# Patient Record
Sex: Male | Born: 1970
Health system: Southern US, Community
[De-identification: ages and names within clinical notes are randomized; demographics above are authoritative.]

## PROBLEM LIST (undated history)

## (undated) DIAGNOSIS — D497 Neoplasm of unspecified behavior of endocrine glands and other parts of nervous system: Secondary | ICD-10-CM

## (undated) DIAGNOSIS — G40209 Localization-related (focal) (partial) symptomatic epilepsy and epileptic syndromes with complex partial seizures, not intractable, without status epilepticus: Secondary | ICD-10-CM

## (undated) DIAGNOSIS — N200 Calculus of kidney: Secondary | ICD-10-CM

## (undated) DIAGNOSIS — F329 Major depressive disorder, single episode, unspecified: Secondary | ICD-10-CM

## (undated) DIAGNOSIS — Z87442 Personal history of urinary calculi: Secondary | ICD-10-CM

## (undated) DIAGNOSIS — M1990 Unspecified osteoarthritis, unspecified site: Secondary | ICD-10-CM

## (undated) DIAGNOSIS — Z9884 Bariatric surgery status: Secondary | ICD-10-CM

## (undated) DIAGNOSIS — M25562 Pain in left knee: Secondary | ICD-10-CM

## (undated) DIAGNOSIS — M25561 Pain in right knee: Secondary | ICD-10-CM

## (undated) DIAGNOSIS — K912 Postsurgical malabsorption, not elsewhere classified: Secondary | ICD-10-CM

## (undated) DIAGNOSIS — K219 Gastro-esophageal reflux disease without esophagitis: Secondary | ICD-10-CM

## (undated) DIAGNOSIS — J9601 Acute respiratory failure with hypoxia: Secondary | ICD-10-CM

## (undated) DIAGNOSIS — M1250 Traumatic arthropathy, unspecified site: Secondary | ICD-10-CM

## (undated) DIAGNOSIS — I10 Essential (primary) hypertension: Secondary | ICD-10-CM

## (undated) DIAGNOSIS — Z6841 Body Mass Index (BMI) 40.0 and over, adult: Secondary | ICD-10-CM

## (undated) DIAGNOSIS — K5790 Diverticulosis of intestine, part unspecified, without perforation or abscess without bleeding: Secondary | ICD-10-CM

## (undated) DIAGNOSIS — G43909 Migraine, unspecified, not intractable, without status migrainosus: Secondary | ICD-10-CM

## (undated) DIAGNOSIS — T8859XA Other complications of anesthesia, initial encounter: Secondary | ICD-10-CM

## (undated) DIAGNOSIS — G2581 Restless legs syndrome: Secondary | ICD-10-CM

## (undated) DIAGNOSIS — J90 Pleural effusion, not elsewhere classified: Secondary | ICD-10-CM

## (undated) DIAGNOSIS — J45909 Unspecified asthma, uncomplicated: Secondary | ICD-10-CM

## (undated) DIAGNOSIS — F419 Anxiety disorder, unspecified: Secondary | ICD-10-CM

## (undated) DIAGNOSIS — J942 Hemothorax: Secondary | ICD-10-CM

## (undated) HISTORY — DX: Localization-related (focal) (partial) symptomatic epilepsy and epileptic syndromes with complex partial seizures, not intractable, without status epilepticus: G40.209

## (undated) HISTORY — DX: Obstructive sleep apnea (adult) (pediatric): G47.33

## (undated) HISTORY — DX: Acute respiratory failure with hypoxia: J96.01

## (undated) HISTORY — DX: Neoplasm of unspecified behavior of endocrine glands and other parts of nervous system: D49.7

## (undated) HISTORY — DX: Traumatic arthropathy, unspecified site: M12.50

## (undated) HISTORY — DX: Restless legs syndrome: G25.81

## (undated) HISTORY — PX: CARPAL TUNNEL RELEASE: SHX101

## (undated) HISTORY — DX: Pain in left knee: M25.562

## (undated) HISTORY — DX: Bariatric surgery status: Z98.84

## (undated) HISTORY — DX: Calculus of kidney: N20.0

## (undated) HISTORY — DX: Body Mass Index (BMI) 40.0 and over, adult: Z684

## (undated) HISTORY — PX: KNEE SURGERY: SHX244

## (undated) HISTORY — PX: LITHOTRIPSY: SUR834

## (undated) HISTORY — DX: Anxiety disorder, unspecified: F41.9

## (undated) HISTORY — DX: Pain in right knee: M25.561

## (undated) HISTORY — DX: Diverticulosis of intestine, part unspecified, without perforation or abscess without bleeding: K57.90

## (undated) HISTORY — DX: Major depressive disorder, single episode, unspecified: F32.9

## (undated) HISTORY — DX: Morbid (severe) obesity due to excess calories: E66.01

## (undated) HISTORY — DX: Pleural effusion, not elsewhere classified: J90

## (undated) HISTORY — DX: Essential (primary) hypertension: I10

## (undated) HISTORY — DX: Hemothorax: J94.2

## (undated) HISTORY — DX: Migraine, unspecified, not intractable, without status migrainosus: G43.909

## (undated) HISTORY — PX: HIP SURGERY: SHX245

## (undated) HISTORY — DX: Postsurgical malabsorption, not elsewhere classified: K91.2

---

## 2000-11-08 HISTORY — PX: HERNIA REPAIR: SHX51

## 2000-11-08 HISTORY — PX: COLON RESECTION: SHX5231

## 2004-09-18 ENCOUNTER — Ambulatory Visit: Payer: Self-pay | Admitting: Family Medicine

## 2004-12-21 ENCOUNTER — Ambulatory Visit: Payer: Self-pay | Admitting: Family Medicine

## 2005-08-12 ENCOUNTER — Ambulatory Visit: Payer: Self-pay | Admitting: Family Medicine

## 2005-08-27 ENCOUNTER — Ambulatory Visit: Payer: Self-pay | Admitting: Family Medicine

## 2008-07-25 ENCOUNTER — Ambulatory Visit (HOSPITAL_BASED_OUTPATIENT_CLINIC_OR_DEPARTMENT_OTHER): Admission: RE | Admit: 2008-07-25 | Discharge: 2008-07-25 | Payer: Self-pay | Admitting: Orthopedic Surgery

## 2010-05-13 ENCOUNTER — Encounter: Admission: RE | Admit: 2010-05-13 | Discharge: 2010-05-13 | Payer: Self-pay | Admitting: Neurosurgery

## 2011-02-01 IMAGING — CT CT L SPINE W/ CM
4 of 10 series · 11 of 33 positions shown, 13 images · IV contrast (omnipaque)
Comparison: None

CLINICAL DATA: Low back pain.  Some right thigh numbness.

 MYELOGRAM INJECTION
TECHNIQUE: Informed consent was obtained from the patient prior to
the procedure, including potential complications of headache,
allergy, infection and pain.  A timeout procedure was performed.
With the patient prone, the lower back was prepped with Betadine.
1% Lidocaine was used for local anesthesia.  Lumbar puncture was
performed at the left L3-4 level using a 6 inches 22 gauge needle
with return of clear CSF.  18 ml of Omnipaque 485was injected into
the subarachnoid space .
TECHNIQUE: Following injection of intrathecal Omnipaque contrast,
spine imaging in multiple projections was performed using
fluoroscopy.
Fluoroscopy Time: 1 minute 10 seconds .
TECHNIQUE: CT imaging of the lumbar spine was performed after
intrathecal contrast administration.  Multiplanar CT image
reconstructions were also generated.

[Series 2: l spine bone · axial · 0.27mm/px · z∈[-238,-150]mm · 2 of 106 slices shown, 3 images]
[im 36/106  soft-tissue]
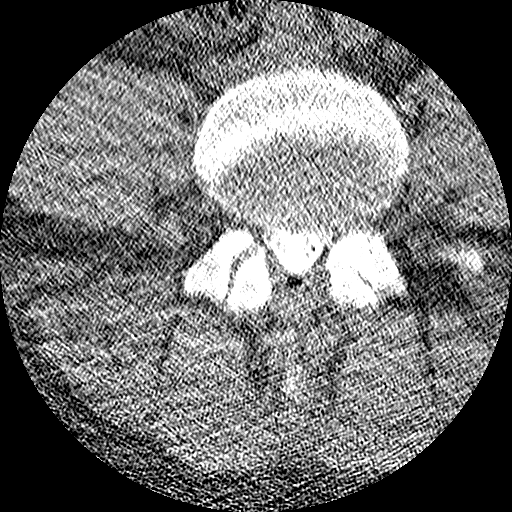
[im 36/106  bone]
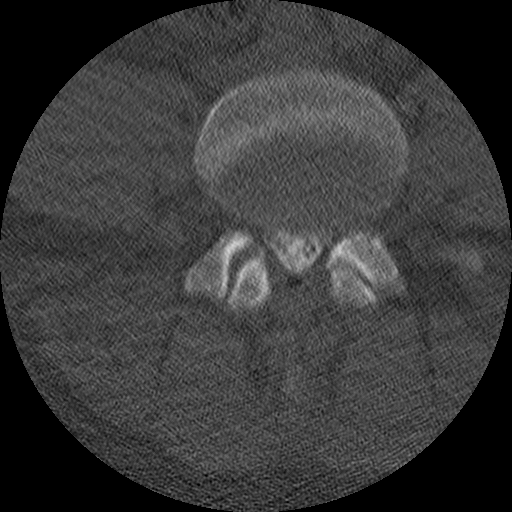
[im 71/106  bone]
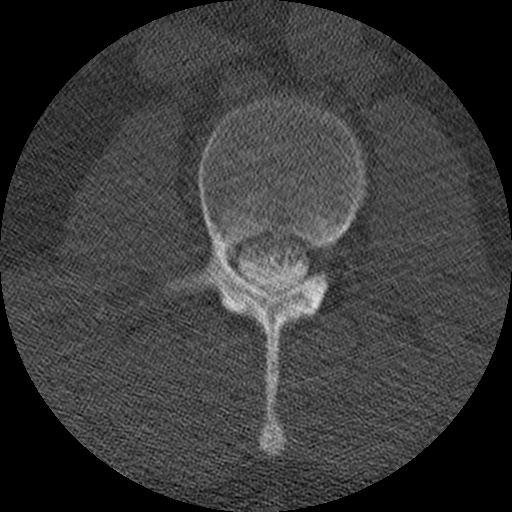

[Series 3: l spine soft · axial · 0.27mm/px · z∈[-260,-130]mm · 3 of 106 slices shown]
[im 27/106  soft-tissue]
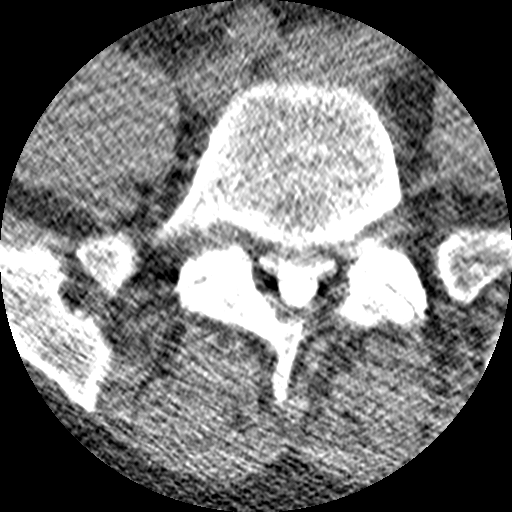
[im 53/106  soft-tissue]
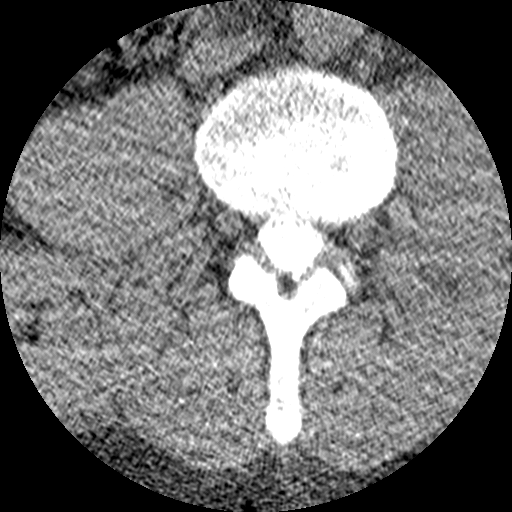
[im 79/106  soft-tissue]
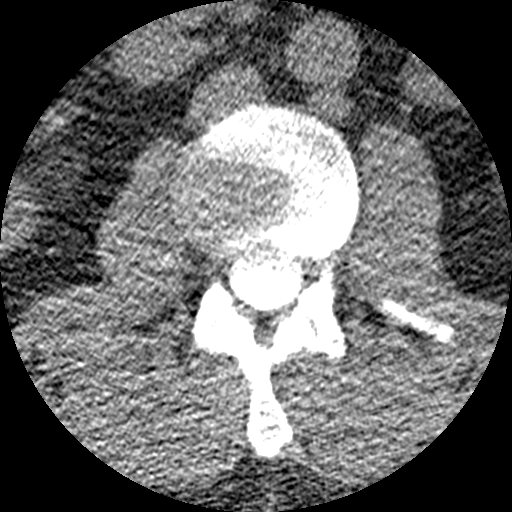

[Series 104: cor/lower level · coronal · 0.27mm/px · 1 of 43 slices shown]
[im 22/43  bone]
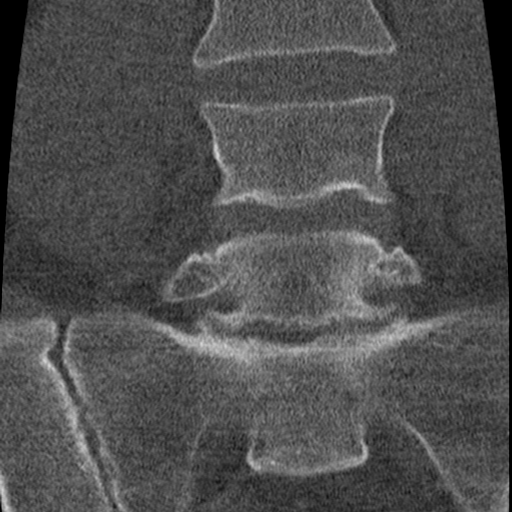

[Series 106: sag · sagittal · 0.53mm/px · 5 of 40 slices shown, 6 images]
[im 14/40  bone]
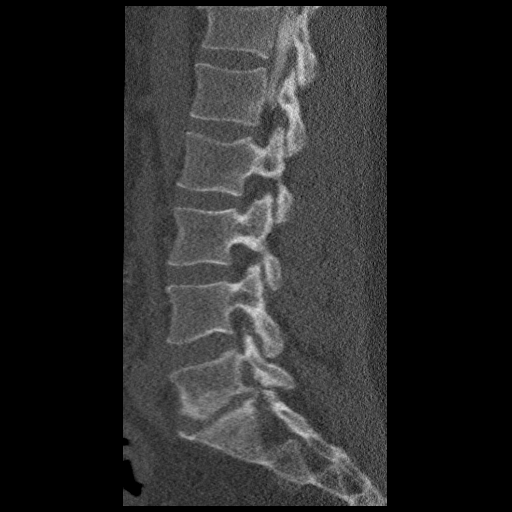
[im 17/40  bone]
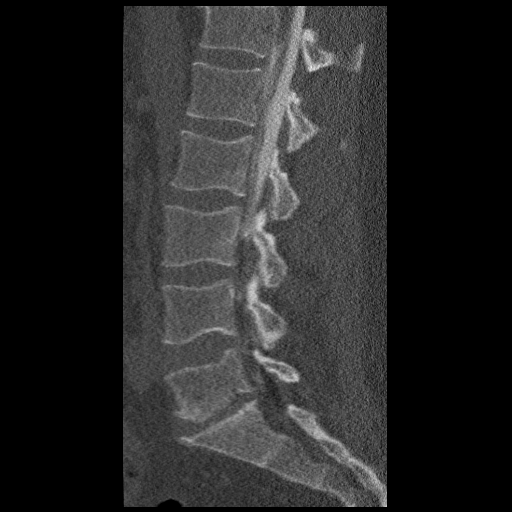
[im 20/40  soft-tissue]
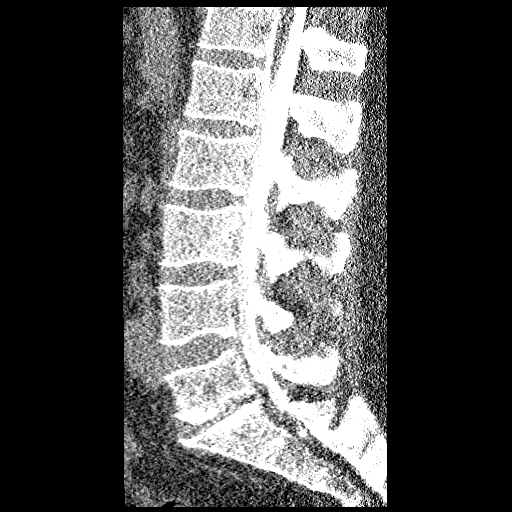
[im 20/40  bone]
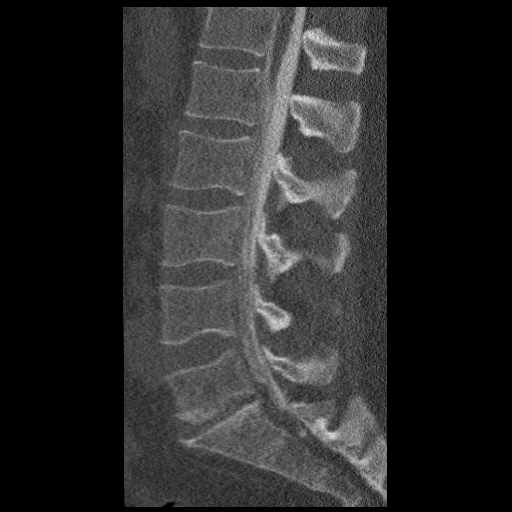
[im 23/40  bone]
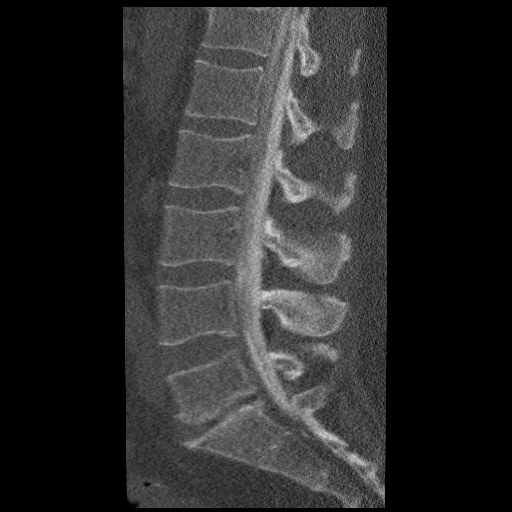
[im 27/40  bone]
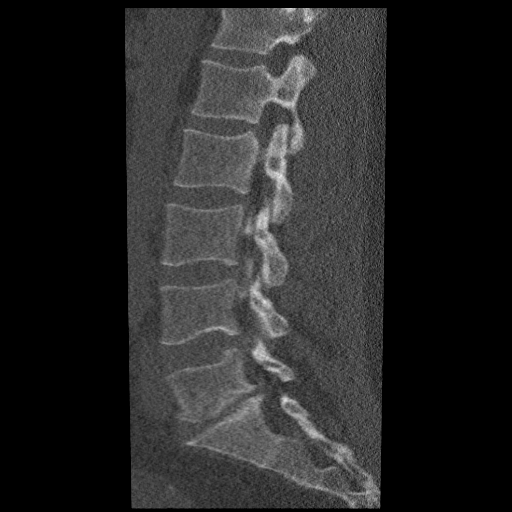

[11 of 33 positions shown; findings below may reference images not displayed]

IMPRESSION: Successful injection of  intrathecal contrast for myelography.

MYELOGRAM LUMBAR
FINDINGS: There is no disc space narrowing at L4-5 or above.  The
L5-S1 disc is narrowed.  There is no central canal stenosis.
There is mild lateral recess narrowing bilaterally at L4-5, more on
the left.  There is some lateral recess stenosis at L5-S1.
Standing lateral flexion extension views do not show any abnormal
motion.
IMPRESSION: Disc space narrowing L5-S1.

No central canal stenosis.

Lateral recess narrowing noted at L4-5 and L5-S1, fairly mild in
degree by myelography. This appears more notable on the left at L4-
5.

CT MYELOGRAPHY LUMBAR SPINE
FINDINGS: There is no abnormality at L3-4 or above.  The discs are
normal in signal characteristics and morphology.  The spinal canal
and foramina are widely patent.  The distal cord and conus are
normal.

At L4-5, the disc bulges in a diffuse fashion, more towards the
left.  There is mild facet and ligamentous prominence.  There is
narrowing of the lateral recesses, left more than right.  Left L5
nerve root compression could occur.

At L5-S1, the disc is degenerated with loss of height.  There is
facet degeneration bilaterally.  The central canal is sufficiently
patent.  There is foraminal encroachment bilaterally that could
effect either L5 nerve root.
IMPRESSION: L4-5:  Disc bulge/shallow protrusion more pronounced towards the
left.  Mild facet and ligamentous hypertrophy.  Lateral recess
narrowing, left more than right.  Left L5 nerve root could be
affected.

L5-S1:  Disc degeneration with loss of height.  Endplate
osteophytes.  Facet degeneration bilaterally.  No central canal
stenosis.  Foraminal narrowing bilaterally could effect either L5
nerve root.

## 2011-03-23 NOTE — Op Note (Signed)
NAMEWESTON, KALLMAN                 ACCOUNT NO.:  [id number]   MEDICAL RECORD NO.:  [id number]          PATIENT TYPE:  AMB   LOCATION:  DSC                          FACILITY:  [facility]   PHYSICIAN:  Cindee Salt, M.D.       DATE OF BIRTH:  1971-04-17   DATE OF PROCEDURE:  07/25/2008  DATE OF DISCHARGE:                               OPERATIVE REPORT   PREOPERATIVE DIAGNOSIS:  Carpal tunnel syndrome, left hand.   POSTOPERATIVE DIAGNOSIS:  Carpal tunnel syndrome, left hand.   OPERATION:  Decompression, left median nerve.   SURGEON:  Cindee Salt, MD   SUMMARYCarolyne Fiscal, RN   ANESTHESIA:  Forearm-based IV regional.   ANESTHESIOLOGIST:  Zenon Mayo, MD.   HISTORY:  The patient is a 40 year old male with a history of carpal  tunnel syndrome, EMG nerve conductions positive, which had not responded  to conservative treatment.  He has elected to undergo surgical  decompression.   Postoperative course was discussed along with risks and complications.  He is aware that there is no guarantee with the surgery, possibility of  infection, recurrence injury to arteries, nerves, and tendons,  incomplete relief of symptoms, and dystrophy.  In the preoperative  area,the patient is seen.  The extremity was marked by both the patient  and surgeon.  Antibiotic is given.  Questions are again encouraged and  answered   PROCEDURE:  The patient was brought to the operating room where forearm-  based IV regional anesthetic was carried out without difficulty under  the direction of Dr. Sampson Goon.  He was prepped using DuraPrep in  supine position with the left arm free.  A longitudinal incision was  made in the palm and carried down through the subcutaneous tissue.  Bleeders were electrocauterized.  Palmar fascia was split.  Superficial  palmar arch was identified.  The flexor tendon to the ring and little  finger identified to the ulnar side of the median nerve.  Carpal  retinaculum was incised  with sharp dissection.  Right angle and Sewall  retractor were placed between skin and forearm fascia.  Fascia was  released for approximately 1.5 cm proximal to the wrist crease under  direct vision.  Canal was explored.  Area compression of the nerve was  immediately apparent.  Significant thickening of the tenosynovium tissue  around flexor tendons was noted.  No further lesions were identified.  The wound was copiously irrigated with saline and closed with  interrupted 5-0 Vicryl Rapide sutures.  Sterile compressive dressing  and splint to the wrist was applied.  The patient tolerated the  procedure well and was taken to the recovery room for observation in  satisfactory condition.  He is discharged home, to return to the [facility] in 1 week on Vicodin.           ______________________________  Cindee Salt, M.D.     GK/MEDQ  D:  07/25/2008  T:  07/26/2008  Job:  [id number]

## 2011-08-11 LAB — BASIC METABOLIC PANEL
Calcium: 9
Chloride: 105
Glucose, Bld: 100 — ABNORMAL HIGH
Sodium: 140

## 2014-04-19 DIAGNOSIS — G40209 Localization-related (focal) (partial) symptomatic epilepsy and epileptic syndromes with complex partial seizures, not intractable, without status epilepticus: Secondary | ICD-10-CM | POA: Insufficient documentation

## 2014-04-19 DIAGNOSIS — G43909 Migraine, unspecified, not intractable, without status migrainosus: Secondary | ICD-10-CM

## 2014-04-19 DIAGNOSIS — G4733 Obstructive sleep apnea (adult) (pediatric): Secondary | ICD-10-CM

## 2014-04-19 HISTORY — DX: Migraine, unspecified, not intractable, without status migrainosus: G43.909

## 2014-04-19 HISTORY — DX: Obstructive sleep apnea (adult) (pediatric): G47.33

## 2014-04-19 HISTORY — DX: Localization-related (focal) (partial) symptomatic epilepsy and epileptic syndromes with complex partial seizures, not intractable, without status epilepticus: G40.209

## 2014-06-13 DIAGNOSIS — D497 Neoplasm of unspecified behavior of endocrine glands and other parts of nervous system: Secondary | ICD-10-CM | POA: Insufficient documentation

## 2014-06-13 HISTORY — DX: Neoplasm of unspecified behavior of endocrine glands and other parts of nervous system: D49.7

## 2014-08-21 DIAGNOSIS — G2581 Restless legs syndrome: Secondary | ICD-10-CM

## 2014-08-21 HISTORY — DX: Restless legs syndrome: G25.81

## 2015-01-20 DIAGNOSIS — M1250 Traumatic arthropathy, unspecified site: Secondary | ICD-10-CM

## 2015-01-20 DIAGNOSIS — M1992 Post-traumatic osteoarthritis, unspecified site: Secondary | ICD-10-CM

## 2015-01-20 HISTORY — DX: Post-traumatic osteoarthritis, unspecified site: M19.92

## 2016-08-23 DIAGNOSIS — A419 Sepsis, unspecified organism: Secondary | ICD-10-CM

## 2016-08-23 DIAGNOSIS — K529 Noninfective gastroenteritis and colitis, unspecified: Secondary | ICD-10-CM | POA: Diagnosis not present

## 2016-08-24 DIAGNOSIS — K529 Noninfective gastroenteritis and colitis, unspecified: Secondary | ICD-10-CM | POA: Diagnosis not present

## 2016-08-24 DIAGNOSIS — A419 Sepsis, unspecified organism: Secondary | ICD-10-CM | POA: Diagnosis not present

## 2016-08-25 DIAGNOSIS — A419 Sepsis, unspecified organism: Secondary | ICD-10-CM | POA: Diagnosis not present

## 2016-08-25 DIAGNOSIS — K529 Noninfective gastroenteritis and colitis, unspecified: Secondary | ICD-10-CM

## 2016-08-26 DIAGNOSIS — A419 Sepsis, unspecified organism: Secondary | ICD-10-CM | POA: Diagnosis not present

## 2016-08-26 DIAGNOSIS — K529 Noninfective gastroenteritis and colitis, unspecified: Secondary | ICD-10-CM | POA: Diagnosis not present

## 2016-08-27 DIAGNOSIS — A419 Sepsis, unspecified organism: Secondary | ICD-10-CM | POA: Diagnosis not present

## 2016-08-27 DIAGNOSIS — K529 Noninfective gastroenteritis and colitis, unspecified: Secondary | ICD-10-CM | POA: Diagnosis not present

## 2016-09-06 ENCOUNTER — Other Ambulatory Visit: Payer: Self-pay | Admitting: Internal Medicine

## 2016-09-06 DIAGNOSIS — D352 Benign neoplasm of pituitary gland: Secondary | ICD-10-CM

## 2016-09-06 DIAGNOSIS — R51 Headache: Secondary | ICD-10-CM

## 2016-09-06 DIAGNOSIS — H5330 Unspecified disorder of binocular vision: Secondary | ICD-10-CM

## 2016-09-06 DIAGNOSIS — R519 Headache, unspecified: Secondary | ICD-10-CM

## 2016-09-07 ENCOUNTER — Other Ambulatory Visit: Payer: Self-pay | Admitting: Internal Medicine

## 2016-09-07 DIAGNOSIS — D369 Benign neoplasm, unspecified site: Secondary | ICD-10-CM

## 2016-09-07 DIAGNOSIS — R51 Headache: Secondary | ICD-10-CM

## 2016-09-07 DIAGNOSIS — R519 Headache, unspecified: Secondary | ICD-10-CM

## 2016-09-07 DIAGNOSIS — H538 Other visual disturbances: Secondary | ICD-10-CM

## 2016-09-27 ENCOUNTER — Ambulatory Visit
Admission: RE | Admit: 2016-09-27 | Discharge: 2016-09-27 | Disposition: A | Discharge status: Home / Self Care | Payer: 59 | Source: Ambulatory Visit | Attending: Internal Medicine | Admitting: Internal Medicine

## 2016-09-27 DIAGNOSIS — D369 Benign neoplasm, unspecified site: Secondary | ICD-10-CM

## 2016-09-27 DIAGNOSIS — R519 Headache, unspecified: Secondary | ICD-10-CM

## 2016-09-27 DIAGNOSIS — H538 Other visual disturbances: Secondary | ICD-10-CM

## 2016-09-27 DIAGNOSIS — R51 Headache: Secondary | ICD-10-CM

## 2017-03-24 DIAGNOSIS — N200 Calculus of kidney: Secondary | ICD-10-CM

## 2017-03-24 DIAGNOSIS — I10 Essential (primary) hypertension: Secondary | ICD-10-CM | POA: Insufficient documentation

## 2017-03-24 DIAGNOSIS — F329 Major depressive disorder, single episode, unspecified: Secondary | ICD-10-CM | POA: Insufficient documentation

## 2017-03-24 DIAGNOSIS — K5790 Diverticulosis of intestine, part unspecified, without perforation or abscess without bleeding: Secondary | ICD-10-CM

## 2017-03-24 DIAGNOSIS — M25561 Pain in right knee: Secondary | ICD-10-CM

## 2017-03-24 DIAGNOSIS — F32A Depression, unspecified: Secondary | ICD-10-CM

## 2017-03-24 DIAGNOSIS — M25562 Pain in left knee: Secondary | ICD-10-CM

## 2017-03-24 DIAGNOSIS — F419 Anxiety disorder, unspecified: Secondary | ICD-10-CM

## 2017-03-24 HISTORY — DX: Anxiety disorder, unspecified: F41.9

## 2017-03-24 HISTORY — DX: Depression, unspecified: F32.A

## 2017-03-24 HISTORY — DX: Diverticulosis of intestine, part unspecified, without perforation or abscess without bleeding: K57.90

## 2017-03-24 HISTORY — DX: Calculus of kidney: N20.0

## 2017-03-24 HISTORY — DX: Pain in right knee: M25.561

## 2017-03-24 HISTORY — DX: Pain in right knee: M25.562

## 2017-11-08 HISTORY — PX: GASTRIC BYPASS: SHX52

## 2017-11-08 HISTORY — PX: APPENDECTOMY: SHX54

## 2017-12-06 DIAGNOSIS — Z6841 Body Mass Index (BMI) 40.0 and over, adult: Secondary | ICD-10-CM

## 2017-12-06 HISTORY — DX: Morbid (severe) obesity due to excess calories: E66.01

## 2018-03-28 DIAGNOSIS — M25562 Pain in left knee: Secondary | ICD-10-CM | POA: Diagnosis not present

## 2018-03-28 DIAGNOSIS — Z6841 Body Mass Index (BMI) 40.0 and over, adult: Secondary | ICD-10-CM | POA: Diagnosis not present

## 2018-03-28 DIAGNOSIS — M25561 Pain in right knee: Secondary | ICD-10-CM | POA: Diagnosis not present

## 2018-03-28 DIAGNOSIS — I10 Essential (primary) hypertension: Secondary | ICD-10-CM | POA: Diagnosis not present

## 2018-03-28 DIAGNOSIS — G8929 Other chronic pain: Secondary | ICD-10-CM | POA: Diagnosis not present

## 2018-03-28 DIAGNOSIS — G4730 Sleep apnea, unspecified: Secondary | ICD-10-CM | POA: Diagnosis not present

## 2018-04-05 DIAGNOSIS — Z6841 Body Mass Index (BMI) 40.0 and over, adult: Secondary | ICD-10-CM | POA: Diagnosis not present

## 2018-04-05 DIAGNOSIS — Z713 Dietary counseling and surveillance: Secondary | ICD-10-CM | POA: Diagnosis not present

## 2018-04-06 DIAGNOSIS — F54 Psychological and behavioral factors associated with disorders or diseases classified elsewhere: Secondary | ICD-10-CM | POA: Diagnosis not present

## 2018-04-06 DIAGNOSIS — Z7189 Other specified counseling: Secondary | ICD-10-CM | POA: Diagnosis not present

## 2018-04-06 DIAGNOSIS — Z6841 Body Mass Index (BMI) 40.0 and over, adult: Secondary | ICD-10-CM | POA: Diagnosis not present

## 2018-04-12 DIAGNOSIS — Z713 Dietary counseling and surveillance: Secondary | ICD-10-CM | POA: Diagnosis not present

## 2018-04-12 DIAGNOSIS — Z6841 Body Mass Index (BMI) 40.0 and over, adult: Secondary | ICD-10-CM | POA: Diagnosis not present

## 2018-04-18 DIAGNOSIS — G4730 Sleep apnea, unspecified: Secondary | ICD-10-CM | POA: Diagnosis not present

## 2018-04-18 DIAGNOSIS — I10 Essential (primary) hypertension: Secondary | ICD-10-CM | POA: Diagnosis not present

## 2018-04-18 DIAGNOSIS — Z6841 Body Mass Index (BMI) 40.0 and over, adult: Secondary | ICD-10-CM | POA: Diagnosis not present

## 2018-05-10 DIAGNOSIS — Z9884 Bariatric surgery status: Secondary | ICD-10-CM | POA: Diagnosis not present

## 2018-05-15 DIAGNOSIS — Z4802 Encounter for removal of sutures: Secondary | ICD-10-CM | POA: Diagnosis not present

## 2018-05-19 ENCOUNTER — Other Ambulatory Visit: Payer: Self-pay | Admitting: *Deleted

## 2018-05-19 NOTE — Patient Outreach (Signed)
[facility]) Care Management  05/19/2018  Carl Edwards [birth year] [id number]   Subjective: Telephone call to patient's home  / mobile number, no answer, message states invalid number, and unable to leave a message.    Objective: Per KPN (Knowledge Performance Now, point of care tool), [employer] iCollaborate, and chart review, patient hospitalized 05/03/18 - 6/12/16/17 for morbid obesity and status post bariatric surgery.   Patient also has a history of hypertension, sleep apnea with CPAP, seizures, restless leg syndrome, and diverticulosis.     Assessment: Received [employer] Transition of care referral on 05/19/18.  Transition of care follow up pending patient contact.      Plan: RNCM will send unsuccessful outreach  letter, [facility] pamphlet, will call patient for 2nd telephone outreach attempt, transition of care follow up, and proceed with case closure, within 10 business days if no return call.        Carl Edwards, BSN, [place] Management [facility] Telephonic CM Phone: [phone] Fax: [phone]

## 2018-05-22 ENCOUNTER — Ambulatory Visit: Payer: Self-pay | Admitting: *Deleted

## 2018-05-22 ENCOUNTER — Other Ambulatory Visit: Payer: Self-pay | Admitting: *Deleted

## 2018-05-22 NOTE — Patient Outreach (Signed)
[facility]) Care Management  05/22/2018  Carl Edwards 03-31-1971 [id number]   Subjective: Telephone call to patient's home  / mobile number, no answer, message states invalid number, and unable to leave a message.    Objective: Per KPN (Knowledge Performance Now, point of care tool), [employer] iCollaborate, and chart review, patient hospitalized 05/03/18 - 6/12/16/17 for morbid obesity and status post bariatric surgery.   Patient also has a history of hypertension, sleep apnea with CPAP, seizures, restless leg syndrome, and diverticulosis.     Assessment: Received [employer] Transition of care referral on 05/19/18.  Transition of care follow up pending patient contact.      Plan: RNCM has sent unsuccessful outreach  letter, [facility] pamphlet, will call patient for 3rd telephone outreach attempt, transition of care follow up, and proceed with case closure, within 10 business days if no return call.       Harmony Sandell H. Annia Friendly, BSN, [place] Management [facility] Telephonic CM Phone: [phone] Fax: [phone]

## 2018-05-23 ENCOUNTER — Other Ambulatory Visit: Payer: Self-pay | Admitting: *Deleted

## 2018-05-23 DIAGNOSIS — Z6841 Body Mass Index (BMI) 40.0 and over, adult: Secondary | ICD-10-CM | POA: Diagnosis not present

## 2018-05-23 NOTE — Patient Outreach (Signed)
[place] [facility]) Care Management  05/23/2018  ATHARV BARRIERE 11/16/1970 [id number]   Subjective: Telephone call to patient's mobile number [phone]) per Epic, male answering phone, states this is the wrong number, and Mr. Lev Cervone is not at this number. No additional numbers listed in [place].       Objective:Per KPN (Knowledge Performance Now, point of care tool), [employer] iCollaborate, and chart review, patient hospitalized6/26/19 - 6/12/16/17 for morbid obesity and status post bariatric surgery. Patient also has a history of hypertension, sleep apnea with CPAP, seizures, restless leg syndrome, and diverticulosis.     Assessment: Received [employer] Transition of care referral on 05/19/18.Transition of care follow up pending patient contact.     Plan:RNCM has sent unsuccessful outreach letter, [facility] pamphlet, and will proceed with case closure, within 10 business days if no return call.       Delmon Andrada H. Annia Friendly, BSN, [place] Management [facility] Telephonic CM Phone: [phone] Fax: [phone]

## 2018-05-26 DIAGNOSIS — I493 Ventricular premature depolarization: Secondary | ICD-10-CM | POA: Diagnosis not present

## 2018-05-26 DIAGNOSIS — J90 Pleural effusion, not elsewhere classified: Secondary | ICD-10-CM | POA: Diagnosis not present

## 2018-05-26 DIAGNOSIS — J181 Lobar pneumonia, unspecified organism: Secondary | ICD-10-CM | POA: Diagnosis not present

## 2018-06-06 ENCOUNTER — Other Ambulatory Visit: Payer: Self-pay | Admitting: *Deleted

## 2018-06-06 NOTE — Patient Outreach (Signed)
Carl Edwards [facility]) Care Management  06/06/2018  Carl Edwards March 02, 1971 [id number]   No response from patient outreach attempts will proceed with case closure.      Objective:Per KPN (Knowledge Performance Now, point of care tool), [employer] iCollaborate, and chart review, patient hospitalized6/26/19 - 6/12/16/17 for morbid obesity and status post bariatric surgery. Patient also has a history of hypertension, sleep apnea with CPAP, seizures, restless leg syndrome, and diverticulosis.     Assessment: Received [employer] Transition of care referral on 05/19/18.  Transition of care follow up not completed due to unable to contact patient and will proceed with case closure.      Plan:Case closure due to unable to reach.      Carl Edwards H. Annia Friendly, BSN, [place] Management [facility] Telephonic CM Phone: [phone] Fax: [phone]

## 2018-06-12 DIAGNOSIS — Z9884 Bariatric surgery status: Secondary | ICD-10-CM | POA: Insufficient documentation

## 2018-06-12 DIAGNOSIS — J9601 Acute respiratory failure with hypoxia: Secondary | ICD-10-CM | POA: Insufficient documentation

## 2018-06-12 DIAGNOSIS — J942 Hemothorax: Secondary | ICD-10-CM

## 2018-06-12 DIAGNOSIS — J90 Pleural effusion, not elsewhere classified: Secondary | ICD-10-CM

## 2018-06-12 DIAGNOSIS — R0902 Hypoxemia: Secondary | ICD-10-CM | POA: Diagnosis not present

## 2018-06-12 DIAGNOSIS — R0602 Shortness of breath: Secondary | ICD-10-CM | POA: Diagnosis not present

## 2018-06-12 DIAGNOSIS — K912 Postsurgical malabsorption, not elsewhere classified: Secondary | ICD-10-CM

## 2018-06-12 HISTORY — DX: Acute respiratory failure with hypoxia: J96.01

## 2018-06-12 HISTORY — DX: Hemothorax: J94.2

## 2018-06-12 HISTORY — DX: Postsurgical malabsorption, not elsewhere classified: K91.2

## 2018-06-12 HISTORY — DX: Pleural effusion, not elsewhere classified: J90

## 2018-06-12 HISTORY — DX: Bariatric surgery status: Z98.84

## 2018-07-12 ENCOUNTER — Other Ambulatory Visit: Payer: Self-pay | Admitting: Emergency Medicine

## 2018-07-12 DIAGNOSIS — Z9884 Bariatric surgery status: Secondary | ICD-10-CM | POA: Diagnosis not present

## 2018-07-12 DIAGNOSIS — Z713 Dietary counseling and surveillance: Secondary | ICD-10-CM | POA: Diagnosis not present

## 2018-07-12 DIAGNOSIS — R0600 Dyspnea, unspecified: Secondary | ICD-10-CM

## 2018-07-12 DIAGNOSIS — Z6837 Body mass index (BMI) 37.0-37.9, adult: Secondary | ICD-10-CM | POA: Diagnosis not present

## 2018-07-12 DIAGNOSIS — J90 Pleural effusion, not elsewhere classified: Secondary | ICD-10-CM

## 2018-07-12 DIAGNOSIS — R0689 Other abnormalities of breathing: Secondary | ICD-10-CM

## 2018-07-17 ENCOUNTER — Other Ambulatory Visit: Payer: Self-pay

## 2018-07-17 ENCOUNTER — Ambulatory Visit (INDEPENDENT_AMBULATORY_CARE_PROVIDER_SITE_OTHER): Payer: 59

## 2018-07-17 ENCOUNTER — Telehealth: Payer: Self-pay | Admitting: Emergency Medicine

## 2018-07-17 DIAGNOSIS — R0600 Dyspnea, unspecified: Secondary | ICD-10-CM | POA: Diagnosis not present

## 2018-07-17 DIAGNOSIS — J90 Pleural effusion, not elsewhere classified: Secondary | ICD-10-CM | POA: Diagnosis not present

## 2018-07-17 NOTE — Progress Notes (Signed)
Echocardiogram complete.  Constance Hackenberg RDCS  

## 2018-07-17 NOTE — Telephone Encounter (Signed)
Left message for patient to return call regarding results  

## 2018-08-01 NOTE — Progress Notes (Signed)
Cardiology Office Note:    Date:  08/02/2018   ID:  Carl Edwards, DOB 15-Sep-1971, MRN [id number]  PCP:  Carl Brothers, MD  Cardiologist:  Carl More, MD   Referring MD: No ref. provider found  ASSESSMENT:    1. Orthostatic hypotension   2. Syncope, unspecified syncope type   3. Pleural effusion on right   4. Hypertension, unspecified type   5. Obstructive sleep apnea    PLAN:    In order of problems listed above:  1. He is symptomatic is at risk for severe anemia with his trauma and blood loss of his CBC checked screen for adrenal insufficiency with a cortisol and start therapy with Florinef and an alpha agonist.  14-day event monitor is been applied to screen for arrhythmia.  Recent echocardiogram does not show any evidence of left ventricular dysfunction.  He is scheduled for follow-up sleep study with his pulmonologist.  I will see him back in the office in 6 weeks or sooner review his monitor.  His wife will continue to follow supine and standing blood pressures at home 2. Clearly looks to be orthostatic and hypotension in etiology initiate therapy 3. He had a significant hemothorax and likely had blood loss is at risk for anemia follow-up CT scan showed little or no residual fluid 4. Worsened no longer on antihypertensive agents 5. Scheduled for outpatient sleep study  Next appointment 6 weeks  Medication Adjustments/Labs and Tests Ordered: Current medicines are reviewed at length with the patient today.  Concerns regarding medicines are outlined above.  Orders Placed This Encounter  Procedures  . CBC  . Cortisol  . LONG TERM MONITOR (3-14 DAYS)  . EKG 12-Lead   Meds ordered this encounter  Medications  . midodrine (PROAMATINE) 5 MG tablet    Sig: Take 1 tablet (5 mg total) by mouth 3 (three) times daily with meals.    Dispense:  90 tablet    Refill:  3  . fludrocortisone (FLORINEF) 0.1 MG tablet    Sig: Take 1 tablet (0.1 mg total) by mouth daily.    Dispense:  30  tablet    Refill:  3     Chief Complaint  Patient presents with  . Pleural Effusion    History of Present Illness:    Carl Edwards is a 47 y.o. male who is being seen today for the evaluation of orthostatic dizziness and syncopal episode at the request of the discharge physician when he left [facility] and the patient and wife request On 06/12/2018 she was admitted to [facility] with respiratory failure hypoxia was felt to be pneumonia and large right pleural effusion which was bloody requiring thoracentesis there is a history of recent bariatric surgery 05/03/2018.    She was seen at [facility] cardiology preoperatively 05/21/2016 and had a myocardial perfusion study performed.  Results are unavailable.    I reviewed the hospital admission records there was no cardiology evaluation or echocardiogram performed.  CBC was normal except for hemoglobin 11.7 renal function was normal and proBNP level was 177.  X-ray showed collapse of the right lung with large effusion but no evidence of congestive heart failure and there is no notation or findings of acute coronary syndrome arrhythmia or cardiology complications and at discharge the patient was advised to be seen by cardiology  follow-up.  He had no documented arrhythmia or cardiac complication of his admissions to the hospital.  X-rays operative notes discharge summaries labs reviewed from 2  admissions to the hospital prior to this visit. \ Prior to his surgery he had no problems with dizziness lightheadedness or loss of consciousness.  He does have obstructive sleep apnea and has been seen by his pulmonologist and is undergoing a sleep study.  Since the time of his surgery with a hemoperitoneum extensive ecchymosis and a hemo-thorax he is felt profoundly weak lightheaded standing and had one episode of brief loss of consciousness.  No associated palpitation or chest pain he feels very weak short of breath with activity he has  had outpatient labs performed including a normal hemoglobin A1c and BMP but has not had a repeat hemoglobin.  He has background history of 20 years ago having chest pain but no known heart disease and has had no arrhythmia.  He has no history of endocrine disease has not been on long-term steroids and until now has not had orthostatic hypotension.  His wife is a nurse his blood pressure has been less than 90 standing today in my office he is 86 systolic and lightheaded.  He will have labs drawn including a CBC and very concerned he has severe anemia after his recent trauma and blood loss cortisol level to screen for adrenal insufficiency and I will initiate therapy with Florinef and midodrine in view of his symptoms.  Echocardiogram performed prior to the visit does not show evidence of cardiomyopathy has some right ventricular dysfunction consistent with his sleep apnea.  He has a remote history of seizure as a child and had a witnessed episode of loss of consciousness without seizure activity he recovered quickly and he was not postictal.  He is not having orthopnea or TIA.  Past Medical History:  Diagnosis Date  . Hypertension   . Pleural effusion on right     Past Surgical History:  Procedure Laterality Date  . APPENDECTOMY    . CARPAL TUNNEL RELEASE Left   . COLON RESECTION    . GASTRIC BYPASS    . HERNIA REPAIR    . KNEE SURGERY Bilateral   . LITHOTRIPSY      Current Medications: Current Meds  Medication Sig  . BREO ELLIPTA 100-25 MCG/INH AEPB Inhale 1 puff into the lungs 2 (two) times daily.  Marland Kitchen buPROPion (WELLBUTRIN XL) 150 MG 24 hr tablet Take 150 mg by mouth daily.  Marland Kitchen buPROPion (WELLBUTRIN XL) 300 MG 24 hr tablet Take 300 mg by mouth daily.  . Calcium Carbonate-Vit D-Min (CALCIUM 1200 PO) Take 1 tablet by mouth daily.  . Calcium Polycarbophil (FIBER-CAPS PO) Take 4 capsules by mouth daily.  . Cholecalciferol (VITAMIN D3) 1000 units CAPS Take 1 capsule by mouth daily.  Mariane Baumgarten  Calcium (STOOL SOFTENER PO) Take 4 tablets by mouth daily.  . Ferrous Sulfate (IRON) 325 (65 Fe) MG TABS Take 1 tablet by mouth daily.  . fluticasone (FLONASE) 50 MCG/ACT nasal spray use 1 spray in each nostril daily as needed for congestion/allergies  . montelukast (SINGULAIR) 10 MG tablet Take 10 mg by mouth at bedtime.  . Multiple Vitamin (MULTIVITAMIN) tablet Take 2 tablets by mouth daily.  Marland Kitchen omeprazole (PRILOSEC) 40 MG capsule Take 40 mg by mouth daily.  . Oxycodone HCl 10 MG TABS Take 10 mg by mouth every 6 (six) hours as needed.  . Oxymorphone HCl, Crush Resist, (OPANA ER, CRUSH RESISTANT,) 20 MG PO T12A TAKE 1 TABLET EVERY 12 HOURS  . PROAIR HFA 108 (90 Base) MCG/ACT inhaler Inhale 1 puff into the lungs every 6 (six) hours  as needed.  . traZODone (DESYREL) 150 MG tablet Take 150 mg by mouth at bedtime.     Allergies:   Acetaminophen   Social History   Socioeconomic History  . Marital status: Married    Spouse name: Not on file  . Number of children: Not on file  . Years of education: Not on file  . Highest education level: Not on file  Occupational History  . Not on file  Social Needs  . Financial resource strain: Not on file  . Food insecurity:    Worry: Not on file    Inability: Not on file  . Transportation needs:    Medical: Not on file    Non-medical: Not on file  Tobacco Use  . Smoking status: Never Smoker  . Smokeless tobacco: Former Network engineer and Sexual Activity  . Alcohol use: Not Currently  . Drug use: Not Currently  . Sexual activity: Not on file  Lifestyle  . Physical activity:    Days per week: Not on file    Minutes per session: Not on file  . Stress: Not on file  Relationships  . Social connections:    Talks on phone: Not on file    Gets together: Not on file    Attends religious service: Not on file    Active member of club or organization: Not on file    Attends meetings of clubs or organizations: Not on file    Relationship status:  Not on file  Other Topics Concern  . Not on file  Social History Narrative  . Not on file     Family History: The patient's family history includes Heart Problems in his mother; Lung cancer in his father and maternal grandfather; Pulmonary embolism in his mother.  ROS:   Review of Systems  Constitution: Positive for malaise/fatigue.  HENT: Negative.   Eyes: Negative.   Cardiovascular: Positive for dyspnea on exertion, leg swelling and syncope.  Respiratory: Positive for shortness of breath, snoring and wheezing.   Hematologic/Lymphatic: Bruises/bleeds easily.  Skin: Negative.   Musculoskeletal: Positive for back pain.  Gastrointestinal: Negative.   Genitourinary: Negative.   Neurological: Positive for dizziness and loss of balance.  Psychiatric/Behavioral: Negative.   Allergic/Immunologic: Negative.    Please see the history of present illness.     All other systems reviewed and are negative.  EKGs/Labs/Other Studies Reviewed:    The following studies were reviewed today:  Documentation of Prolonged Non Face-To-Face Time (> 31 minutes) I personally reviewed prior medical records (both internal and external) for this encounter.  This included review of historical hospital records, office notes, cardiac studies (e.g. Echocardiograms, Stress Tests, Cardiac Catheterizations, Event Monitors, etc), radiologic studies (e.g. MRIs, CTs, Xrays, etc), laboratory data, etc.  The pertinent findings are outlined in my note.  The total non face to face time spent for record review was 35 minutes   This does not include the review of my own personal notes and records.  EKG:  EKG is  ordered today.  The ekg ordered today demonstrates sinus rhythm normal  Chest x-ray 06/16/2018 shows a small amount of right residual pleural fluid after thoracentesis there is adjacent mild volume loss otherwise normal no pneumothorax or findings of pneumonia or heart failure  EKG 06/12/2018 sinus rhythm  occasional PVCs read as abnormal.  Recent Labs:   05/26/2018 troponin was nondetectable BNP level 06/12/2018 was low at 177 arterial blood gas 06/12/2018 pH 7.43 PCO2 45 PO2 51 on room  air   Physical Exam:    VS:  BP (!) 84/60 (Patient Position: Standing)   Pulse 69   Ht 6\' 6"  (1.981 m)   Wt (!) 324 lb (147 kg)   SpO2 93%   BMI 37.44 kg/m     Wt Readings from Last 3 Encounters:  08/02/18 (!) 324 lb (147 kg)  09/24/16 (!) 380 lb (172.4 kg)     GEN: He does not appear pale he remains quite obese but has lost a significant amount of weight since his surgery well nourished, well developed in no acute distress HEENT: Normal NECK: No JVD; No carotid bruits LYMPHATICS: No lymphadenopathy CARDIAC: Normal cardiovascular exam RRR, no murmurs, rubs, gallops RESPIRATORY:  Clear to auscultation without rales, wheezing or rhonchi  ABDOMEN: Soft, non-tender, non-distended MUSCULOSKELETAL:  No edema; No deformity  SKIN: Warm and dry NEUROLOGIC:  Alert and oriented x 3 PSYCHIATRIC:  Normal affect     Signed, Carl More, MD  08/02/2018 5:39 PM    [place]

## 2018-08-02 ENCOUNTER — Encounter: Payer: Self-pay | Admitting: Cardiology

## 2018-08-02 ENCOUNTER — Ambulatory Visit (INDEPENDENT_AMBULATORY_CARE_PROVIDER_SITE_OTHER): Payer: 59 | Admitting: Cardiology

## 2018-08-02 VITALS — BP 84/60 | HR 69 | Ht 78.0 in | Wt 324.0 lb

## 2018-08-02 DIAGNOSIS — I951 Orthostatic hypotension: Secondary | ICD-10-CM

## 2018-08-02 DIAGNOSIS — R55 Syncope and collapse: Secondary | ICD-10-CM

## 2018-08-02 DIAGNOSIS — J90 Pleural effusion, not elsewhere classified: Secondary | ICD-10-CM

## 2018-08-02 DIAGNOSIS — G4733 Obstructive sleep apnea (adult) (pediatric): Secondary | ICD-10-CM | POA: Diagnosis not present

## 2018-08-02 DIAGNOSIS — I10 Essential (primary) hypertension: Secondary | ICD-10-CM | POA: Diagnosis not present

## 2018-08-02 MED ORDER — FLUDROCORTISONE ACETATE 0.1 MG PO TABS: 0.1000 mg | ORAL_TABLET | Freq: Every day | ORAL | 3 refills | Status: DC

## 2018-08-02 MED ORDER — MIDODRINE HCL 5 MG PO TABS: 5.0000 mg | ORAL_TABLET | Freq: Three times a day (TID) | ORAL | 3 refills | Status: DC

## 2018-08-02 NOTE — Patient Instructions (Signed)
Medication Instructions:  Your physician has recommended you make the following change in your medication:   START midodrine (proamatine) 5 mg: Take 1 tablet three times daily   START fludrocortisone (florinef) 0.1 mg: Take 1 tablet daily   START over the counter salt tablets: take 1 tablet daily   Labwork: Your physician recommends that you return for lab work today: CBC, cortisol level.   Testing/Procedures: You had an EKG today.   Your physician has recommended that you wear a ZIO patch monitor. ZIO patch monitors are medical devices that record the heart's electrical activity. Doctors most often use these monitors to diagnose arrhythmias. Arrhythmias are problems with the speed or rhythm of the heartbeat. The monitor is a small, portable device. You can wear one while you do your normal daily activities. This is usually used to diagnose what is causing palpitations/syncope (passing out). Wear for 14 days.   Follow-Up: Your physician recommends that you schedule a follow-up appointment in: 6 weeks.   If you need a refill on your cardiac medications before your next appointment, please call your pharmacy.   Thank you for choosing [facility]! Robyne Peers, RN [phone]   Midodrine tablets What is this medicine? MIDODRINE (MI doe dreen) is used to treat low blood pressure in patients who have symptoms like dizziness when going from a sitting to a standing position. This medicine may be used for other purposes; ask your health care provider or pharmacist if you have questions. COMMON BRAND NAME(S): Orvaten, ProAmatine What should I tell my health care provider before I take this medicine? They need to know if you have any of the following conditions: -difficulty passing urine -heart disease -high blood pressure -kidney disease -over active thyroid -pheochromocytoma -an unusual or allergic reaction to midodrine, other medicines, foods, dyes, or  preservatives -pregnant or trying to get pregnant -breast-feeding How should I use this medicine? Take this medicine by mouth with a glass of water. Follow the directions on the prescription label. The last dose of this medicine should not be taken after the evening meal or less than 4 hours before bedtime. When you lie down for any length of time after taking this medicine, high blood pressure can occur. Do not take this medicine if you will be lying down for any length of time. Do not take your medicine more often than directed. Do not stop taking except on your doctor's advice. Talk to your pediatrician regarding the use of this medicine in children. Special care may be needed. Overdosage: If you think you have taken too much of this medicine contact a poison control center or emergency room at once. NOTE: This medicine is only for you. Do not share this medicine with others. What if I miss a dose? If you miss a dose, take it as soon as you can. If it is almost time for your next dose, take only that dose. Do not take double or extra doses. What may interact with this medicine? Do not take this medicine with any of the following medications: -MAOIs like Carbex, Eldepryl, Marplan, Nardil, and Parnate -medicines called ergot alkaloids -medicines for colds and breathing difficulties or weight loss -procarbazine This medicine may also interact with the following medications: -cimetidine -digoxin -flecainide -fludrocortisone -metformin -procainamide -quinidine -ranitidine -triamterene -medicines called alpha-blockers like doxazosin, prazosin, and terazosin This list may not describe all possible interactions. Give your health care provider a list of all the medicines, herbs, non-prescription drugs, or dietary supplements you use. Also tell  them if you smoke, drink alcohol, or use illegal drugs. Some items may interact with your medicine. What should I watch for while using this  medicine? Visit your doctor or health care professional for regular checks on your progress. You may get drowsy or dizzy. Do not drive, use machinery, or do anything that needs mental alertness until you know how this medicine affects you. Do not stand or sit up quickly, especially if you are an older patient. This reduces the risk of dizzy or fainting spells. Your mouth may get dry. Chewing sugarless gum or sucking hard candy, and drinking plenty of water may help. Contact your doctor if the problem does not go away or is severe. Do not treat yourself for coughs, colds, or pain while you are taking this medicine without asking your doctor or health care professional for advice. Some ingredients may increase your blood pressure. What side effects may I notice from receiving this medicine? Side effects that you should report to your doctor or health care professional as soon as possible: -awareness of heart beating -blurred vision -headache -irregular heartbeat, palpitations, or chest pain -pounding in the ears -skin rash, hives Side effects that usually do not require medical attention (report to your doctor or health care professional if they continue or are bothersome): -change in heart rate -chills -goose bumps -increased need to urinate -itching -stomach pain -tingling in the skin or scalp This list may not describe all possible side effects. Call your doctor for medical advice about side effects. You may report side effects to FDA at 1-800-FDA-1088. Where should I keep my medicine? Keep out of the reach of children. Store at room temperature between 15 and 30 degrees C (59 and 86 degrees F). Throw away any unused medicine after the expiration date. NOTE: This sheet is a summary. It may not cover all possible information. If you have questions about this medicine, talk to your doctor, pharmacist, or health care provider.  2018 Elsevier/Gold Standard (2008-05-13  13:51:24)    Fludrocortisone tablets What is this medicine? FLUDROCORTISONE (floo droe KOR ti sone) is a corticosteroid. It is used to treat Addison's disease and to treat a salt losing condition called adrenogenital syndrome. This medicine may be used for other purposes; ask your health care provider or pharmacist if you have questions. COMMON BRAND NAME(S): Florinef What should I tell my health care provider before I take this medicine? They need to know if you have any of these conditions: -Cushing's syndrome -diabetes -heart problems or disease -high blood pressure -infection like herpes, measles, tuberculosis, or chickenpox -liver disease -myasthenia gravis -osteoporosis -stomach, ulcer or intestine disease including colitis and diverticulitis -thyroid problem -an unusual or allergic reaction to fludrocortisone, corticosteroids, other medicines, lactose, foods, dyes, or preservatives -pregnant or trying to get pregnant -breast-feeding How should I use this medicine? Take this medicine by mouth with a glass of water. Follow the directions on the prescription label. Take it with food or milk to avoid stomach upset. If you are taking this medicine once a day, take it in the morning. Do not take more medicine than you are told to take. Do not suddenly stop taking your medicine because you may develop a severe reaction. Your doctor will tell you how much medicine to take. If your doctor wants you to stop the medicine, the dose may be slowly lowered over time to avoid any side effects. Talk to your pediatrician regarding the use of this medicine in children. Special care may be  needed. Patients over 96 years old may have a stronger reaction and need a smaller dose. Overdosage: If you think you have taken too much of this medicine contact a poison control center or emergency room at once. NOTE: This medicine is only for you. Do not share this medicine with others. What if I miss a  dose? If you miss a dose, take it as soon as you can. If it is almost time for your next dose, take only that dose. Do not take double or extra doses. What may interact with this medicine? Do not take this medicine with any of the following medications: -mifepristone, RU-486 This medicine may also interact with the following medications: -amphotericin B -aspirin and aspirin-like drugs -barbiturates like phenobarbital -digoxin -diuretics -male hormones, like estrogens or progestins and birth control pills -male hormones -medicines for diabetes like insulin -medicines that treat or prevent blood clots like warfarin -phenytoin -rifampin -vaccines This list may not describe all possible interactions. Give your health care provider a list of all the medicines, herbs, non-prescription drugs, or dietary supplements you use. Also tell them if you smoke, drink alcohol, or use illegal drugs. Some items may interact with your medicine. What should I watch for while using this medicine? Visit your doctor or health care professional for regular checks on your progress. If you are taking this medicine over a prolonged period, carry an identification card with your name and address, the type and dose of your medicine, and your doctor's name and address. This medicine may increase your risk of getting an infection. Stay away from people who are sick. Tell your doctor or health care professional if you are around anyone with measles or chickenpox. If you are going to have surgery, tell your doctor or health care professional that you have taken this medicine within the last twelve months. Ask your doctor or health care professional about your diet. You may need to lower the amount of salt you eat. The medicine can increase your blood sugar. If you are a diabetic check with your doctor if you need help adjusting the dose of your diabetic medicine. What side effects may I notice from receiving this  medicine? Side effects that you should report to your doctor or health care professional as soon as possible: -changes in vision -mental depression, mood swings, mistaken feelings of self importance or of being mistreated -sudden weight gain -swelling of the feet or lower legs -unusually weak or tired Side effects that usually do not require medical attention (report to your doctor or health care professional if they continue or are bothersome): -dizziness -headache -loss of appetite -nausea, vomiting -trouble sleeping This list may not describe all possible side effects. Call your doctor for medical advice about side effects. You may report side effects to FDA at 1-800-FDA-1088. Where should I keep my medicine? Keep out of the reach of children. Store at room temperature between 15 and 30 degrees C (59 and 86 degrees F). Protect from excessive heat. Throw away any unused medicine after the expiration date. NOTE: This sheet is a summary. It may not cover all possible information. If you have questions about this medicine, talk to your doctor, pharmacist, or health care provider.  2018 Elsevier/Gold Standard (2008-03-04 15:29:43)

## 2018-08-03 ENCOUNTER — Telehealth: Payer: Self-pay

## 2018-08-03 ENCOUNTER — Telehealth: Payer: Self-pay | Admitting: *Deleted

## 2018-08-03 ENCOUNTER — Encounter: Payer: Self-pay | Admitting: *Deleted

## 2018-08-03 DIAGNOSIS — E2740 Unspecified adrenocortical insufficiency: Secondary | ICD-10-CM

## 2018-08-03 DIAGNOSIS — R0602 Shortness of breath: Secondary | ICD-10-CM

## 2018-08-03 DIAGNOSIS — R7989 Other specified abnormal findings of blood chemistry: Secondary | ICD-10-CM

## 2018-08-03 HISTORY — DX: Shortness of breath: R06.02

## 2018-08-03 LAB — CBC
HEMOGLOBIN: 12.6 g/dL — AB (ref 13.0–17.7)
Hematocrit: 39.2 % (ref 37.5–51.0)
MCH: 26 pg — AB (ref 26.6–33.0)
MCHC: 32.1 g/dL (ref 31.5–35.7)
MCV: 81 fL (ref 79–97)
Platelets: 226 10*3/uL (ref 150–450)
RBC: 4.85 x10E6/uL (ref 4.14–5.80)
RDW: 15.3 % (ref 12.3–15.4)
WBC: 6.8 10*3/uL (ref 3.4–10.8)

## 2018-08-03 LAB — CORTISOL: CORTISOL: 4.4 ug/dL

## 2018-08-03 NOTE — Telephone Encounter (Signed)
Patient notified that his hemaglobin is normal, but cortisol is low. Patient advised that he should see endocrinology ASAP for symptomatic adrenal insufficiency and stay on current medications.    Referral was entered and patient was advised he should receive call from endocrinology office to set up appointment date and time.   Patient agrees to plan and verbalized understanding.

## 2018-08-03 NOTE — Telephone Encounter (Signed)
Left message on patients machine to return call for lab results.

## 2018-08-03 NOTE — Telephone Encounter (Signed)
Called patient to inform him that Dr. Bettina Gavia recommends a CTA chest to rule out pulmonary embolism. Patient is agreeable. Advised patient we would contact [facility] to schedule first thing tomorrow morning as Dr. Bettina Gavia wants this testing done tomorrow. Patient verbalized understanding. No further questions.

## 2018-08-04 ENCOUNTER — Telehealth: Payer: Self-pay

## 2018-08-04 ENCOUNTER — Other Ambulatory Visit: Payer: Self-pay

## 2018-08-04 DIAGNOSIS — R0602 Shortness of breath: Secondary | ICD-10-CM

## 2018-08-04 NOTE — Telephone Encounter (Signed)
Carl Edwards returned call and is aware of appointment for CTA Chest at [facility] today.  He will arrive at 3:00 for blood work prior and then have CTA Chest at 4:00.    Patient agrees to plan and verbalized understanding.

## 2018-08-04 NOTE — Telephone Encounter (Signed)
Left message for patient to return call regarding having CTA Chest to rule out pulmonary embolism per Dr Bettina Gavia today at [facility].  Also left message on patients wife Bayard Males voicemail to return call.

## 2018-08-08 ENCOUNTER — Telehealth: Payer: Self-pay

## 2018-08-08 NOTE — Telephone Encounter (Signed)
Patient advised that there was no evidence of PE seen on CTA Chest done at [facility].  Patient verbalized understanding.

## 2018-08-11 ENCOUNTER — Ambulatory Visit: Payer: Medicare Other

## 2018-09-18 ENCOUNTER — Encounter: Payer: Self-pay | Admitting: Endocrinology

## 2018-09-18 ENCOUNTER — Ambulatory Visit: Payer: Medicare Other | Admitting: Cardiology

## 2018-10-26 ENCOUNTER — Ambulatory Visit: Payer: Medicare Other | Admitting: Endocrinology

## 2018-11-12 NOTE — Progress Notes (Signed)
Cardiology Office Note:    Date:  11/13/2018   ID:  Carl Edwards, DOB 09-04-1971, MRN [id number]  PCP:  Garwin Brothers, MD  Cardiologist:  Shirlee More, MD    Referring MD: Garwin Brothers, MD    ASSESSMENT:    1. Hypotension, unspecified hypotension type   2. Chest pain in adult   3. Essential hypertension   4. Morbid obesity with BMI of 40.0-44.9, adult (HCC)   5. Chest pain, unspecified type    PLAN:    In order of problems listed above:  1. Improved resolved no longer an alpha agonist. 2. Atypical check myocardial perfusion study especially with his complex postoperative course if normal will see back in the office as needed 3. Improved no longer requires pharmacologic therapy with weight loss 4. Market improvement 80 pound weight loss with surgery goal is to get BMI less than 30   Next appointment: As needed if his myocardial perfusion study is normal   Medication Adjustments/Labs and Tests Ordered: Current medicines are reviewed at length with the patient today.  Concerns regarding medicines are outlined above.  Orders Placed This Encounter  Procedures  . Myocardial Perfusion Imaging  . EKG 12-Lead   No orders of the defined types were placed in this encounter.   Chief Complaint  Patient presents with  . Hypotension    History of Present Illness:    Carl Edwards is a 48 y.o. male with a hx of orthostatic hypotension last seen 08/02/2018.  He was initiated alpha agonist with a symptomatic hypotension.  3-day ZIO monitor showed no arrhythmia of note and his cortisol level was low and hemoglobin 12.6.  CT of the chest showed no evidence of pulmonary embolism. Compliance with diet, lifestyle and medications: Yes  He is improved no shortness of breath palpitation or syncope but has nonexertional chest pain Past Medical History:  Diagnosis Date  . Acute hypoxemic respiratory failure ([place]) 06/12/2018  . Anxiety 03/24/2017  . Bloody pleural effusion 06/12/2018  . Complex  partial seizure disorder ([place]) 04/19/2014  . Depression 03/24/2017  . Diverticulosis 03/24/2017  . Hypertension   . Knee pain, bilateral 03/24/2017  . Migraine 04/19/2014  . Morbid obesity with BMI of 40.0-44.9, adult ([place]) 12/06/2017  . Nephrolithiasis 03/24/2017  . Obstructive sleep apnea 04/19/2014  . Pituitary tumor 06/13/2014  . Pleural effusion on right   . Postoperative intestinal malabsorption 06/12/2018  . Posttraumatic arthropathy 01/20/2015  . Restless legs syndrome 08/21/2014  . S/P laparoscopic sleeve gastrectomy 06/12/2018    Past Surgical History:  Procedure Laterality Date  . APPENDECTOMY    . CARPAL TUNNEL RELEASE Left   . COLON RESECTION    . GASTRIC BYPASS    . HERNIA REPAIR    . KNEE SURGERY Bilateral   . LITHOTRIPSY      Current Medications: Current Meds  Medication Sig  . BREO ELLIPTA 100-25 MCG/INH AEPB Inhale 1 puff into the lungs 2 (two) times daily.  Marland Kitchen buPROPion (WELLBUTRIN XL) 150 MG 24 hr tablet Take 150 mg by mouth daily.  Marland Kitchen buPROPion (WELLBUTRIN XL) 300 MG 24 hr tablet Take 300 mg by mouth daily.  . Calcium Polycarbophil (FIBER-CAPS PO) Take 4 capsules by mouth daily.  Mariane Baumgarten Calcium (STOOL SOFTENER PO) Take 4 tablets by mouth daily as needed.   . Ferrous Sulfate (IRON) 325 (65 Fe) MG TABS Take 1 tablet by mouth daily.  . fluticasone (FLONASE) 50 MCG/ACT nasal spray use 1 spray in each nostril daily  as needed for congestion/allergies  . montelukast (SINGULAIR) 10 MG tablet Take 10 mg by mouth at bedtime.  . Multiple Vitamin (MULTIVITAMIN) tablet Take 2 tablets by mouth daily.  Marland Kitchen omeprazole (PRILOSEC) 40 MG capsule Take 40 mg by mouth daily as needed.   . Oxycodone HCl 10 MG TABS Take 10 mg by mouth every 6 (six) hours as needed.  . Oxymorphone HCl, Crush Resist, (OPANA ER, CRUSH RESISTANT,) 20 MG PO T12A TAKE 1 TABLET EVERY 12 HOURS  . PROAIR HFA 108 (90 Base) MCG/ACT inhaler Inhale 1 puff into the lungs every 6 (six) hours as needed.  . traZODone  (DESYREL) 150 MG tablet Take 150 mg by mouth at bedtime.     Allergies:   Acetaminophen   Social History   Socioeconomic History  . Marital status: Married    Spouse name: Not on file  . Number of children: Not on file  . Years of education: Not on file  . Highest education level: Not on file  Occupational History  . Not on file  Social Needs  . Financial resource strain: Not on file  . Food insecurity:    Worry: Not on file    Inability: Not on file  . Transportation needs:    Medical: Not on file    Non-medical: Not on file  Tobacco Use  . Smoking status: Never Smoker  . Smokeless tobacco: Former Network engineer and Sexual Activity  . Alcohol use: Not Currently  . Drug use: Not Currently  . Sexual activity: Not on file  Lifestyle  . Physical activity:    Days per week: Not on file    Minutes per session: Not on file  . Stress: Not on file  Relationships  . Social connections:    Talks on phone: Not on file    Gets together: Not on file    Attends religious service: Not on file    Active member of club or organization: Not on file    Attends meetings of clubs or organizations: Not on file    Relationship status: Not on file  Other Topics Concern  . Not on file  Social History Narrative  . Not on file     Family History: The patient's family history includes Heart Problems in his mother; Lung cancer in his father and maternal grandfather; Pulmonary embolism in his mother. ROS:   Please see the history of present illness.    All other systems reviewed and are negative.  EKGs/Labs/Other Studies Reviewed:    The following studies were reviewed today:  EKG:  EKG ordered today.  The ekg ordered today demonstrates sinus rhythm and is normal  Recent Labs: 08/02/2018: Hemoglobin 12.6; Platelets 226  Recent Lipid Panel No results found for: CHOL, TRIG, HDL, CHOLHDL, VLDL, LDLCALC, LDLDIRECT  Physical Exam:    VS:  BP 126/78 (BP Location: Left Arm, Patient  Position: Sitting, Cuff Size: Large)   Pulse 78   Ht 6\' 6"  (1.981 m)   Wt (!) 312 lb (141.5 kg)   SpO2 95%   BMI 36.06 kg/m     Wt Readings from Last 3 Encounters:  11/13/18 (!) 312 lb (141.5 kg)  08/02/18 (!) 324 lb (147 kg)  09/24/16 (!) 380 lb (172.4 kg)     GEN:  Well nourished, well developed in no acute distress marked weight loss    Carl Edwards is a 48 y.o. male with a hx of postoperative hypotension last seen 08/02/2018. Compliance with  diet, lifestyle and medications: Yes  He has had a nice response to bariatric surgery has lost in excess of 80 pounds he feels improved his hypotension is resolved no longer takes midodrine has had no edema shortness of breath palpitation or syncope but has intermittent chest pain more on the right than the left and more pleuritic than pressure in nature.  We discussed a follow-up chest x-ray he declined he said he had discussed with his pulmonary physician in view of his complicated postoperative course advised to undergo an ischemia evaluation pharmacologic with limitations from joint pain.  If normal will see back in the office as needed. Past Medical History:  Diagnosis Date  . Acute hypoxemic respiratory failure ([place]) 06/12/2018  . Anxiety 03/24/2017  . Bloody pleural effusion 06/12/2018  . Complex partial seizure disorder ([place]) 04/19/2014  . Depression 03/24/2017  . Diverticulosis 03/24/2017  . Hypertension   . Knee pain, bilateral 03/24/2017  . Migraine 04/19/2014  . Morbid obesity with BMI of 40.0-44.9, adult ([place]) 12/06/2017  . Nephrolithiasis 03/24/2017  . Obstructive sleep apnea 04/19/2014  . Pituitary tumor 06/13/2014  . Pleural effusion on right   . Postoperative intestinal malabsorption 06/12/2018  . Posttraumatic arthropathy 01/20/2015  . Restless legs syndrome 08/21/2014  . S/P laparoscopic sleeve gastrectomy 06/12/2018    Past Surgical History:  Procedure Laterality Date  . APPENDECTOMY    . CARPAL TUNNEL RELEASE Left   . COLON  RESECTION    . GASTRIC BYPASS    . HERNIA REPAIR    . KNEE SURGERY Bilateral   . LITHOTRIPSY      Current Medications: Current Meds  Medication Sig  . BREO ELLIPTA 100-25 MCG/INH AEPB Inhale 1 puff into the lungs 2 (two) times daily.  Marland Kitchen buPROPion (WELLBUTRIN XL) 150 MG 24 hr tablet Take 150 mg by mouth daily.  Marland Kitchen buPROPion (WELLBUTRIN XL) 300 MG 24 hr tablet Take 300 mg by mouth daily.  . Calcium Polycarbophil (FIBER-CAPS PO) Take 4 capsules by mouth daily.  Mariane Baumgarten Calcium (STOOL SOFTENER PO) Take 4 tablets by mouth daily as needed.   . Ferrous Sulfate (IRON) 325 (65 Fe) MG TABS Take 1 tablet by mouth daily.  . fluticasone (FLONASE) 50 MCG/ACT nasal spray use 1 spray in each nostril daily as needed for congestion/allergies  . montelukast (SINGULAIR) 10 MG tablet Take 10 mg by mouth at bedtime.  . Multiple Vitamin (MULTIVITAMIN) tablet Take 2 tablets by mouth daily.  Marland Kitchen omeprazole (PRILOSEC) 40 MG capsule Take 40 mg by mouth daily as needed.   . Oxycodone HCl 10 MG TABS Take 10 mg by mouth every 6 (six) hours as needed.  . Oxymorphone HCl, Crush Resist, (OPANA ER, CRUSH RESISTANT,) 20 MG PO T12A TAKE 1 TABLET EVERY 12 HOURS  . PROAIR HFA 108 (90 Base) MCG/ACT inhaler Inhale 1 puff into the lungs every 6 (six) hours as needed.  . traZODone (DESYREL) 150 MG tablet Take 150 mg by mouth at bedtime.     Allergies:   Acetaminophen   Social History   Socioeconomic History  . Marital status: Married    Spouse name: Not on file  . Number of children: Not on file  . Years of education: Not on file  . Highest education level: Not on file  Occupational History  . Not on file  Social Needs  . Financial resource strain: Not on file  . Food insecurity:    Worry: Not on file    Inability: Not on file  .  Transportation needs:    Medical: Not on file    Non-medical: Not on file  Tobacco Use  . Smoking status: Never Smoker  . Smokeless tobacco: Former Network engineer and Sexual  Activity  . Alcohol use: Not Currently  . Drug use: Not Currently  . Sexual activity: Not on file  Lifestyle  . Physical activity:    Days per week: Not on file    Minutes per session: Not on file  . Stress: Not on file  Relationships  . Social connections:    Talks on phone: Not on file    Gets together: Not on file    Attends religious service: Not on file    Active member of club or organization: Not on file    Attends meetings of clubs or organizations: Not on file    Relationship status: Not on file  Other Topics Concern  . Not on file  Social History Narrative  . Not on file     Family History: The patient's family history includes Heart Problems in his mother; Lung cancer in his father and maternal grandfather; Pulmonary embolism in his mother. ROS:   Please see the history of present illness.    All other systems reviewed and are negative.  EKGs/Labs/Other Studies Reviewed:    The following studies were reviewed today:  EKG:  EKG ordered today.  The ekg ordered today demonstrates sinus rhythm and is normal  Recent Labs: 08/02/2018: Hemoglobin 12.6; Platelets 226  Recent Lipid Panel No results found for: CHOL, TRIG, HDL, CHOLHDL, VLDL, LDLCALC, LDLDIRECT  Physical Exam:    VS:  BP 126/78 (BP Location: Left Arm, Patient Position: Sitting, Cuff Size: Large)   Pulse 78   Ht 6\' 6"  (1.981 m)   Wt (!) 312 lb (141.5 kg)   SpO2 95%   BMI 36.06 kg/m    Standing blood pressure 106/80 Wt Readings from Last 3 Encounters:  11/13/18 (!) 312 lb (141.5 kg)  08/02/18 (!) 324 lb (147 kg)  09/24/16 (!) 380 lb (172.4 kg)      HEENT: Normal NECK: No JVD; No carotid bruits LYMPHATICS: No lymphadenopathy CARDIAC: RRR, no murmurs, rubs, gallops RESPIRATORY:  Clear to auscultation without rales, wheezing or rhonchi  ABDOMEN: Soft, non-tender, non-distended MUSCULOSKELETAL:  No edema; No deformity  SKIN: Warm and dry NEUROLOGIC:  Alert and oriented x 3 PSYCHIATRIC:   Normal affect    Signed, Shirlee More, MD  11/13/2018 11:55 AM    [place]

## 2018-11-13 ENCOUNTER — Encounter: Payer: Self-pay | Admitting: Cardiology

## 2018-11-13 ENCOUNTER — Encounter: Payer: Self-pay | Admitting: *Deleted

## 2018-11-13 ENCOUNTER — Ambulatory Visit (INDEPENDENT_AMBULATORY_CARE_PROVIDER_SITE_OTHER): Payer: Managed Care, Other (non HMO) | Admitting: Cardiology

## 2018-11-13 VITALS — BP 126/78 | HR 78 | Ht 78.0 in | Wt 312.0 lb

## 2018-11-13 DIAGNOSIS — I10 Essential (primary) hypertension: Secondary | ICD-10-CM

## 2018-11-13 DIAGNOSIS — R079 Chest pain, unspecified: Secondary | ICD-10-CM | POA: Insufficient documentation

## 2018-11-13 DIAGNOSIS — Z6841 Body Mass Index (BMI) 40.0 and over, adult: Secondary | ICD-10-CM | POA: Diagnosis not present

## 2018-11-13 DIAGNOSIS — I959 Hypotension, unspecified: Secondary | ICD-10-CM

## 2018-11-13 HISTORY — DX: Chest pain, unspecified: R07.9

## 2018-11-13 NOTE — Patient Instructions (Signed)
Medication Instructions:  Your physician recommends that you continue on your current medications as directed. Please refer to the Current Medication list given to you today.  If you need a refill on your cardiac medications before your next appointment, please call your pharmacy.   Lab work: None  If you have labs (blood work) drawn today and your tests are completely normal, you will receive your results only by: Marland Kitchen MyChart Message (if you have MyChart) OR . A paper copy in the mail If you have any lab test that is abnormal or we need to change your treatment, we will call you to review the results.  Testing/Procedures: You had an EKG today.   Your physician has requested that you have a lexiscan myoview. For further information please visit [email or web address]. Please follow instruction sheet, as given.  Follow-Up: At [facility], you and your health needs are our priority.  As part of our continuing mission to provide you with exceptional heart care, we have created designated Provider Care Teams.  These Care Teams include your primary Cardiologist (physician) and Advanced Practice Providers (APPs -  Physician Assistants and Nurse Practitioners) who all work together to provide you with the care you need, when you need it. You will need a follow up appointment as needed if symptoms worsen or fail to improve.     Regadenoson injection What is this medicine? REGADENOSON is used to test the heart for coronary artery disease. It is used in patients who can not exercise for their stress test. This medicine may be used for other purposes; ask your health care provider or pharmacist if you have questions. COMMON BRAND NAME(S): Lexiscan What should I tell my health care provider before I take this medicine? They need to know if you have any of these conditions: -heart problems -lung or breathing disease, like asthma or COPD -an unusual or allergic reaction to regadenoson, other  medicines, foods, dyes, or preservatives -pregnant or trying to get pregnant -breast-feeding How should I use this medicine? This medicine is for injection into a vein. It is given by a health care professional in a hospital or clinic setting. Talk to your pediatrician regarding the use of this medicine in children. Special care may be needed. Overdosage: If you think you have taken too much of this medicine contact a poison control center or emergency room at once. NOTE: This medicine is only for you. Do not share this medicine with others. What if I miss a dose? This does not apply. What may interact with this medicine? -caffeine -dipyridamole -guarana -theophylline This list may not describe all possible interactions. Give your health care provider a list of all the medicines, herbs, non-prescription drugs, or dietary supplements you use. Also tell them if you smoke, drink alcohol, or use illegal drugs. Some items may interact with your medicine. What should I watch for while using this medicine? Your condition will be monitored carefully while you are receiving this medicine. Do not take medicines, foods, or drinks with caffeine (like coffee, tea, or colas) for at least 12 hours before your test. If you do not know if something contains caffeine, ask your health care professional. What side effects may I notice from receiving this medicine? Side effects that you should report to your doctor or health care professional as soon as possible: -allergic reactions like skin rash, itching or hives, swelling of the face, lips, or tongue -breathing problems -chest pain, tightness or palpitations -severe headache Side effects that usually do  not require medical attention (report to your doctor or health care professional if they continue or are bothersome): -flushing -headache -irritation or pain at site where injected -nausea, vomiting This list may not describe all possible side effects.  Call your doctor for medical advice about side effects. You may report side effects to FDA at 1-800-FDA-1088. Where should I keep my medicine? This drug is given in a hospital or clinic and will not be stored at home. NOTE: This sheet is a summary. It may not cover all possible information. If you have questions about this medicine, talk to your doctor, pharmacist, or health care provider.  2019 Elsevier/Gold Standard (2008-06-24 15:08:13)     Cardiac Nuclear Scan A cardiac nuclear scan is a test that is done to check the flow of blood to your heart. It is done when you are resting and when you are exercising. The test looks for problems such as:  Not enough blood reaching a portion of the heart.  The heart muscle not working as it should. You may need this test if:  You have heart disease.  You have had lab results that are not normal.  You have had heart surgery or a balloon procedure to open up blocked arteries (angioplasty).  You have chest pain.  You have shortness of breath. In this test, a special dye (tracer) is put into your bloodstream. The tracer will travel to your heart. A camera will then take pictures of your heart to see how the tracer moves through your heart. This test is usually done at a hospital and takes 2-4 hours. Tell a doctor about:  Any allergies you have.  All medicines you are taking, including vitamins, herbs, eye drops, creams, and over-the-counter medicines.  Any problems you or family members have had with anesthetic medicines.  Any blood disorders you have.  Any surgeries you have had.  Any medical conditions you have.  Whether you are pregnant or may be pregnant. What are the risks? Generally, this is a safe test. However, problems may occur, such as:  Serious chest pain and heart attack. This is only a risk if the stress portion of the test is done.  Rapid heartbeat.  A feeling of warmth in your chest. This feeling usually does not  last long.  Allergic reaction to the tracer. What happens before the test?  Ask your doctor about changing or stopping your normal medicines. This is important.  Follow instructions from your doctor about what you cannot eat or drink.  Remove your jewelry on the day of the test. What happens during the test?  An IV tube will be inserted into one of your veins.  Your doctor will give you a small amount of tracer through the IV tube.  You will wait for 20-40 minutes while the tracer moves through your bloodstream.  Your heart will be monitored with an electrocardiogram (ECG).  You will lie down on an exam table.  Pictures of your heart will be taken for about 15-20 minutes.  You may also have a stress test. For this test, one of these things may be done: ? You will be asked to exercise on a treadmill or a stationary bike. ? You will be given medicines that will make your heart work harder. This is done if you are unable to exercise.  When blood flow to your heart has peaked, a tracer will again be given through the IV tube.  After 20-40 minutes, you will get back  on the exam table. More pictures will be taken of your heart.  Depending on the tracer that is used, more pictures may need to be taken 3-4 hours later.  Your IV tube will be removed when the test is over. The test may vary among doctors and hospitals. What happens after the test?  Ask your doctor: ? Whether you can return to your normal schedule, including diet, activities, and medicines. ? Whether you should drink more fluids. This will help to remove the tracer from your body. Drink enough fluid to keep your pee (urine) pale yellow.  Ask your doctor, or the department that is doing the test: ? When will my results be ready? ? How will I get my results? Summary  A cardiac nuclear scan is a test that is done to check the flow of blood to your heart.  Tell your doctor whether you are pregnant or may be  pregnant.  Before the test, ask your doctor about changing or stopping your normal medicines. This is important.  Ask your doctor whether you can return to your normal activities. You may be asked to drink more fluids. This information is not intended to replace advice given to you by your health care provider. Make sure you discuss any questions you have with your health care provider. Document Released: 04/10/2018 Document Revised: 04/10/2018 Document Reviewed: 04/10/2018 Elsevier Interactive Patient Education  2019 [employer].

## 2018-11-21 ENCOUNTER — Ambulatory Visit: Payer: Medicare Other | Admitting: Endocrinology

## 2018-11-21 ENCOUNTER — Telehealth: Payer: Self-pay | Admitting: *Deleted

## 2018-11-21 NOTE — Telephone Encounter (Signed)
Left message on voicemail per DPR in reference to upcoming appointment scheduled on 11/28/18 at 1130 with detailed instructions given per Myocardial Perfusion Study Information Sheet for the test. LM to arrive 15 minutes early, and that it is imperative to arrive on time for appointment to keep from having the test rescheduled. If you need to cancel or reschedule your appointment, please call the office within 24 hours of your appointment. Failure to do so may result in a cancellation of your appointment, and a $50 no show fee. Phone number given for call back for any questions. Jorge Amparo, [place]anae [place]

## 2018-11-28 ENCOUNTER — Ambulatory Visit: Payer: Medicare Other

## 2018-11-29 ENCOUNTER — Ambulatory Visit: Payer: Medicare Other

## 2018-12-12 ENCOUNTER — Ambulatory Visit (INDEPENDENT_AMBULATORY_CARE_PROVIDER_SITE_OTHER): Payer: Managed Care, Other (non HMO) | Admitting: Endocrinology

## 2018-12-12 ENCOUNTER — Encounter: Payer: Self-pay | Admitting: Endocrinology

## 2018-12-12 VITALS — BP 122/84 | HR 78 | Ht 78.0 in | Wt 305.4 lb

## 2018-12-12 DIAGNOSIS — I959 Hypotension, unspecified: Secondary | ICD-10-CM

## 2018-12-12 DIAGNOSIS — D497 Neoplasm of unspecified behavior of endocrine glands and other parts of nervous system: Secondary | ICD-10-CM

## 2018-12-12 DIAGNOSIS — Z6835 Body mass index (BMI) 35.0-35.9, adult: Secondary | ICD-10-CM | POA: Diagnosis not present

## 2018-12-12 DIAGNOSIS — N521 Erectile dysfunction due to diseases classified elsewhere: Secondary | ICD-10-CM | POA: Diagnosis not present

## 2018-12-12 LAB — URINALYSIS, ROUTINE W REFLEX MICROSCOPIC
Bilirubin Urine: NEGATIVE
HGB URINE DIPSTICK: NEGATIVE
Ketones, ur: NEGATIVE
Leukocytes, UA: NEGATIVE
Nitrite: NEGATIVE
RBC / HPF: NONE SEEN (ref 0–?)
Specific Gravity, Urine: >=1.03 — AB (ref 1.000–1.030)
Total Protein, Urine: NEGATIVE
Urine Glucose: NEGATIVE
Urobilinogen, UA: 0.2 (ref 0.0–1.0)
pH: 5 (ref 5.0–8.0)

## 2018-12-12 MED ORDER — COSYNTROPIN 0.25 MG IJ SOLR
0.2500 mg | Freq: Once | INTRAMUSCULAR | Status: AC
  Administered 2018-12-12: 0.25 mg via INTRAMUSCULAR

## 2018-12-12 NOTE — Patient Instructions (Signed)
Blood tests are requested for you today.  We'll let you know about the results.  I would be happy to request another MRI, but the nodule has been stable, and you could therefore skip it if you want. I would be happy to see you back here as needed.

## 2018-12-12 NOTE — Progress Notes (Signed)
Subjective:    Patient ID: Carl Edwards, male    DOB: 1971/10/29, 48 y.o.   MRN: [id number]  HPI Pt is referred by Dr Bettina Gavia, for low cortisol.  Pt was noted to have a pituitary microadenoma in 2015.  He presented with slight dizziness sensation in the head, but no assoc LOC.  sxs persist.  He was noted to have a low cortisol level in late 2019.  no h/o abdominal or brain injury.  No h/o cancer, thyroid problems, seizures, hypoglycemia, amyloidosis, tuberculosis, or diabetes.  No h/o ketoconazole, rifampin, or dilantin.    Past Medical History:  Diagnosis Date  . Acute hypoxemic respiratory failure ([place]) 06/12/2018  . Anxiety 03/24/2017  . Bloody pleural effusion 06/12/2018  . Complex partial seizure disorder ([place]) 04/19/2014  . Depression 03/24/2017  . Diverticulosis 03/24/2017  . Hypertension   . Knee pain, bilateral 03/24/2017  . Migraine 04/19/2014  . Morbid obesity with BMI of 40.0-44.9, adult ([place]) 12/06/2017  . Nephrolithiasis 03/24/2017  . Obstructive sleep apnea 04/19/2014  . Pituitary tumor 06/13/2014  . Pleural effusion on right   . Postoperative intestinal malabsorption 06/12/2018  . Posttraumatic arthropathy 01/20/2015  . Restless legs syndrome 08/21/2014  . S/P laparoscopic sleeve gastrectomy 06/12/2018    Past Surgical History:  Procedure Laterality Date  . APPENDECTOMY    . CARPAL TUNNEL RELEASE Left   . COLON RESECTION    . GASTRIC BYPASS    . HERNIA REPAIR    . KNEE SURGERY Bilateral   . LITHOTRIPSY      Social History   Socioeconomic History  . Marital status: Married    Spouse name: Not on file  . Number of children: Not on file  . Years of education: Not on file  . Highest education level: Not on file  Occupational History  . Not on file  Social Needs  . Financial resource strain: Not on file  . Food insecurity:    Worry: Not on file    Inability: Not on file  . Transportation needs:    Medical: Not on file    Non-medical: Not on file  Tobacco Use  .  Smoking status: Never Smoker  . Smokeless tobacco: Former Network engineer and Sexual Activity  . Alcohol use: Not Currently  . Drug use: Not Currently  . Sexual activity: Not on file  Lifestyle  . Physical activity:    Days per week: Not on file    Minutes per session: Not on file  . Stress: Not on file  Relationships  . Social connections:    Talks on phone: Not on file    Gets together: Not on file    Attends religious service: Not on file    Active member of club or organization: Not on file    Attends meetings of clubs or organizations: Not on file    Relationship status: Not on file  . Intimate partner violence:    Fear of current or ex partner: Not on file    Emotionally abused: Not on file    Physically abused: Not on file    Forced sexual activity: Not on file  Other Topics Concern  . Not on file  Social History Narrative  . Not on file    Current Outpatient Medications on File Prior to Visit  Medication Sig Dispense Refill  . BREO ELLIPTA 100-25 MCG/INH AEPB Inhale 1 puff into the lungs 2 (two) times daily.  3  . buPROPion (WELLBUTRIN XL)  150 MG 24 hr tablet Take 150 mg by mouth daily.  3  . buPROPion (WELLBUTRIN XL) 300 MG 24 hr tablet Take 300 mg by mouth daily.    . fluticasone (FLONASE) 50 MCG/ACT nasal spray use 1 spray in each nostril daily as needed for congestion/allergies    . hydrOXYzine (ATARAX/VISTARIL) 50 MG tablet Take 50 mg by mouth 3 (three) times daily as needed.    . montelukast (SINGULAIR) 10 MG tablet Take 10 mg by mouth at bedtime.    . Multiple Vitamin (MULTIVITAMIN) tablet Take 2 tablets by mouth daily.    Marland Kitchen omeprazole (PRILOSEC) 40 MG capsule Take 40 mg by mouth daily as needed.     . Oxycodone HCl 10 MG TABS Take 10 mg by mouth every 6 (six) hours as needed.  0  . Oxymorphone HCl, Crush Resist, (OPANA ER, CRUSH RESISTANT,) 20 MG PO T12A TAKE 1 TABLET EVERY 12 HOURS    . predniSONE (STERAPRED UNI-PAK 21 TAB) 5 MG (21) TBPK tablet Take 5 mg by  mouth daily. Taper    . PROAIR HFA 108 (90 Base) MCG/ACT inhaler Inhale 1 puff into the lungs every 6 (six) hours as needed.  3   No current facility-administered medications on file prior to visit.     Allergies  Allergen Reactions  . Acetaminophen Nausea Only    Family History  Problem Relation Age of Onset  . Heart Problems Mother   . Pulmonary embolism Mother   . Lung cancer Father   . Lung cancer Maternal Grandfather     BP 122/84 (BP Location: Left Arm, Patient Position: Sitting, Cuff Size: Large)   Pulse 78   Ht 6\' 6"  (1.981 m)   Wt (!) 305 lb 6.4 oz (138.5 kg)   SpO2 91%   BMI 35.29 kg/m   Review of Systems denies polyuria, loss of smell, rash, galactorrhea, easy bruising, change in facial appearance, rhinorrhea.  He has intermitt headache and blurry vision. Depression is well-controlled.  He has lost 100 lbs since gastric sleeve in mid-2019.  He seldom has vomiting.     Objective:   Physical Exam VS: see vs page GEN: no distress HEAD: head: no deformity eyes: no periorbital swelling, no proptosis external nose and ears are normal mouth: no lesion seen NECK: supple, thyroid is not enlarged CHEST WALL: no deformity LUNGS: clear to auscultation CV: reg rate and rhythm, no murmur ABD: abdomen is soft, nontender.  no hepatosplenomegaly.  not distended.  no hernia.  Old healed short surgical scars at the abdomen.   MUSCULOSKELETAL: muscle bulk and strength are grossly normal.  no obvious joint swelling.  gait is normal and steady EXTEMITIES: no deformity.  no edema PULSES: no carotid bruit NEURO:  cn 2-12 grossly intact.   readily moves all 4's.  sensation is intact to touch on all 4's.   SKIN:  Normal texture and temperature.  No rash or suspicious lesion is visible.   NODES:  None palpable at the neck PSYCH: alert, well-oriented.  Does not appear anxious nor depressed.    MRI (2018): stable cyst  ACTH stimulation test is done: baseline cortisol level=7 then  Cosyntropin 250 mcg is given im 45 minutes later, cortisol level=19 (normal response)  I have reviewed outside records, and summarized: Pt was noted to have low random cortisol, and referred here. He was rx'ed prednisone for hip pain (pt says he took 1st dose 2 days ago).       Assessment & Plan:  Low random cortisol, new to me.  HPA insuff is excluded by this ACTH test.   Pituitary cyst, stable.  Check pituitary function tests.    Patient Instructions  Blood tests are requested for you today.  We'll let you know about the results.  I would be happy to request another MRI, but the nodule has been stable, and you could therefore skip it if you want. I would be happy to see you back here as needed.

## 2018-12-13 LAB — LUTEINIZING HORMONE: LH: 6.05 m[IU]/mL (ref 1.50–9.30)

## 2018-12-13 LAB — TSH: TSH: 1.22 u[IU]/mL (ref 0.35–4.50)

## 2018-12-13 LAB — BASIC METABOLIC PANEL
BUN: 8 mg/dL (ref 6–23)
CALCIUM: 9.4 mg/dL (ref 8.4–10.5)
CO2: 28 meq/L (ref 19–32)
Chloride: 103 mEq/L (ref 96–112)
Creatinine, Ser: 0.75 mg/dL (ref 0.40–1.50)
GFR: 110.21 mL/min (ref >60.00)
Glucose, Bld: 80 mg/dL (ref 70–99)
Potassium: 4.3 mEq/L (ref 3.5–5.1)
Sodium: 144 mEq/L (ref 135–145)

## 2018-12-13 LAB — FOLLICLE STIMULATING HORMONE: FSH: 4.3 m[IU]/mL (ref 1.4–18.1)

## 2018-12-13 LAB — CORTISOL
Cortisol, Plasma: 7.3 ug/dL
Cortisol, Plasma: 19.2 ug/dL

## 2018-12-13 LAB — T4, FREE: Free T4: 0.94 ng/dL (ref 0.60–1.60)

## 2018-12-14 LAB — TESTOSTERONE,FREE AND TOTAL
TESTOSTERONE FREE: 7 pg/mL — AB (ref 7.2–24.0)
Testosterone: 387 ng/dL (ref 264–916)

## 2018-12-15 LAB — ACTH: C206 ACTH: <5 pg/mL — ABNORMAL LOW (ref 6–50)

## 2018-12-15 LAB — PROLACTIN: PROLACTIN: 13.3 ng/mL (ref 2.0–18.0)

## 2018-12-18 ENCOUNTER — Telehealth: Payer: Self-pay | Admitting: Endocrinology

## 2018-12-18 ENCOUNTER — Encounter: Payer: Self-pay | Admitting: Endocrinology

## 2018-12-18 NOTE — Telephone Encounter (Signed)
Patient stated his lab results where posted on mychart but they are not there now.  He would like to know the results.

## 2018-12-18 NOTE — Telephone Encounter (Signed)
Results have not been released yet by the MD. Once released, pt will receive a call.

## 2018-12-20 ENCOUNTER — Telehealth (HOSPITAL_COMMUNITY): Payer: Self-pay | Admitting: *Deleted

## 2018-12-20 NOTE — Telephone Encounter (Signed)
Left message on voicemail per DPR in reference to upcoming appointment scheduled on 12/26/18 at 1115 with detailed instructions given per Myocardial Perfusion Study Information Sheet for the test. LM to arrive 15 minutes early, and that it is imperative to arrive on time for appointment to keep from having the test rescheduled. If you need to cancel or reschedule your appointment, please call the office within 24 hours of your appointment. Failure to do so may result in a cancellation of your appointment, and a $50 no show fee. Phone number given for call back for any questions.  Letter sent by [employer]. Carl Edwards, Ranae [place]ms

## 2018-12-25 LAB — ARGININE VASOPRESSIN HORMONE
ADH: 1.9 pg/mL (ref 0.0–4.7)
Osmolality Meas: 285 mOsmol/kg (ref 275–295)

## 2018-12-26 ENCOUNTER — Ambulatory Visit: Payer: Medicare Other

## 2018-12-27 ENCOUNTER — Ambulatory Visit: Payer: Medicare Other

## 2019-01-05 DIAGNOSIS — M25562 Pain in left knee: Secondary | ICD-10-CM | POA: Diagnosis not present

## 2019-01-05 DIAGNOSIS — G8929 Other chronic pain: Secondary | ICD-10-CM | POA: Diagnosis not present

## 2019-01-05 DIAGNOSIS — Z5181 Encounter for therapeutic drug level monitoring: Secondary | ICD-10-CM | POA: Diagnosis not present

## 2019-01-05 DIAGNOSIS — M25552 Pain in left hip: Secondary | ICD-10-CM | POA: Diagnosis not present

## 2019-01-05 DIAGNOSIS — I10 Essential (primary) hypertension: Secondary | ICD-10-CM | POA: Diagnosis not present

## 2019-01-05 DIAGNOSIS — Z79899 Other long term (current) drug therapy: Secondary | ICD-10-CM | POA: Diagnosis not present

## 2019-01-12 DIAGNOSIS — G8929 Other chronic pain: Secondary | ICD-10-CM | POA: Diagnosis not present

## 2019-01-12 DIAGNOSIS — M25562 Pain in left knee: Secondary | ICD-10-CM | POA: Diagnosis not present

## 2019-02-02 DIAGNOSIS — M25562 Pain in left knee: Secondary | ICD-10-CM | POA: Diagnosis not present

## 2019-02-02 DIAGNOSIS — Z5181 Encounter for therapeutic drug level monitoring: Secondary | ICD-10-CM | POA: Diagnosis not present

## 2019-02-02 DIAGNOSIS — Z6835 Body mass index (BMI) 35.0-35.9, adult: Secondary | ICD-10-CM | POA: Diagnosis not present

## 2019-02-02 DIAGNOSIS — M25552 Pain in left hip: Secondary | ICD-10-CM | POA: Diagnosis not present

## 2019-02-02 DIAGNOSIS — I10 Essential (primary) hypertension: Secondary | ICD-10-CM | POA: Diagnosis not present

## 2019-02-02 DIAGNOSIS — Z79899 Other long term (current) drug therapy: Secondary | ICD-10-CM | POA: Diagnosis not present

## 2019-02-02 DIAGNOSIS — G8929 Other chronic pain: Secondary | ICD-10-CM | POA: Diagnosis not present

## 2019-03-02 DIAGNOSIS — M25562 Pain in left knee: Secondary | ICD-10-CM | POA: Diagnosis not present

## 2019-03-02 DIAGNOSIS — G8929 Other chronic pain: Secondary | ICD-10-CM | POA: Diagnosis not present

## 2019-03-02 DIAGNOSIS — Z5181 Encounter for therapeutic drug level monitoring: Secondary | ICD-10-CM | POA: Diagnosis not present

## 2019-03-02 DIAGNOSIS — I10 Essential (primary) hypertension: Secondary | ICD-10-CM | POA: Diagnosis not present

## 2019-03-02 DIAGNOSIS — M25552 Pain in left hip: Secondary | ICD-10-CM | POA: Diagnosis not present

## 2019-03-02 DIAGNOSIS — Z6835 Body mass index (BMI) 35.0-35.9, adult: Secondary | ICD-10-CM | POA: Diagnosis not present

## 2019-03-02 DIAGNOSIS — Z79899 Other long term (current) drug therapy: Secondary | ICD-10-CM | POA: Diagnosis not present

## 2019-03-02 DIAGNOSIS — M62838 Other muscle spasm: Secondary | ICD-10-CM | POA: Diagnosis not present

## 2019-03-28 DIAGNOSIS — M62838 Other muscle spasm: Secondary | ICD-10-CM | POA: Diagnosis not present

## 2019-03-28 DIAGNOSIS — G8929 Other chronic pain: Secondary | ICD-10-CM | POA: Diagnosis not present

## 2019-03-28 DIAGNOSIS — M25552 Pain in left hip: Secondary | ICD-10-CM | POA: Diagnosis not present

## 2019-03-28 DIAGNOSIS — Z5181 Encounter for therapeutic drug level monitoring: Secondary | ICD-10-CM | POA: Diagnosis not present

## 2019-03-28 DIAGNOSIS — Z79899 Other long term (current) drug therapy: Secondary | ICD-10-CM | POA: Diagnosis not present

## 2019-03-28 DIAGNOSIS — I10 Essential (primary) hypertension: Secondary | ICD-10-CM | POA: Diagnosis not present

## 2019-04-25 DIAGNOSIS — M25512 Pain in left shoulder: Secondary | ICD-10-CM | POA: Diagnosis not present

## 2019-04-25 DIAGNOSIS — Z79899 Other long term (current) drug therapy: Secondary | ICD-10-CM | POA: Diagnosis not present

## 2019-04-25 DIAGNOSIS — I10 Essential (primary) hypertension: Secondary | ICD-10-CM | POA: Diagnosis not present

## 2019-04-25 DIAGNOSIS — M25552 Pain in left hip: Secondary | ICD-10-CM | POA: Diagnosis not present

## 2019-04-25 DIAGNOSIS — M25562 Pain in left knee: Secondary | ICD-10-CM | POA: Diagnosis not present

## 2019-04-25 DIAGNOSIS — E559 Vitamin D deficiency, unspecified: Secondary | ICD-10-CM | POA: Diagnosis not present

## 2019-04-25 DIAGNOSIS — Z5181 Encounter for therapeutic drug level monitoring: Secondary | ICD-10-CM | POA: Diagnosis not present

## 2019-04-25 DIAGNOSIS — G8929 Other chronic pain: Secondary | ICD-10-CM | POA: Diagnosis not present

## 2019-05-02 DIAGNOSIS — M25512 Pain in left shoulder: Secondary | ICD-10-CM | POA: Diagnosis not present

## 2019-06-22 DIAGNOSIS — M25562 Pain in left knee: Secondary | ICD-10-CM | POA: Diagnosis not present

## 2019-06-22 DIAGNOSIS — Z5181 Encounter for therapeutic drug level monitoring: Secondary | ICD-10-CM | POA: Diagnosis not present

## 2019-06-22 DIAGNOSIS — I10 Essential (primary) hypertension: Secondary | ICD-10-CM | POA: Diagnosis not present

## 2019-06-22 DIAGNOSIS — G8929 Other chronic pain: Secondary | ICD-10-CM | POA: Diagnosis not present

## 2019-06-22 DIAGNOSIS — M25552 Pain in left hip: Secondary | ICD-10-CM | POA: Diagnosis not present

## 2019-06-22 DIAGNOSIS — Z79899 Other long term (current) drug therapy: Secondary | ICD-10-CM | POA: Diagnosis not present

## 2019-07-20 DIAGNOSIS — M25552 Pain in left hip: Secondary | ICD-10-CM | POA: Diagnosis not present

## 2019-07-20 DIAGNOSIS — I10 Essential (primary) hypertension: Secondary | ICD-10-CM | POA: Diagnosis not present

## 2019-07-20 DIAGNOSIS — Z79899 Other long term (current) drug therapy: Secondary | ICD-10-CM | POA: Diagnosis not present

## 2019-07-20 DIAGNOSIS — Z5181 Encounter for therapeutic drug level monitoring: Secondary | ICD-10-CM | POA: Diagnosis not present

## 2019-07-20 DIAGNOSIS — G8929 Other chronic pain: Secondary | ICD-10-CM | POA: Diagnosis not present

## 2019-07-20 DIAGNOSIS — G4700 Insomnia, unspecified: Secondary | ICD-10-CM | POA: Diagnosis not present

## 2019-08-20 DIAGNOSIS — G4700 Insomnia, unspecified: Secondary | ICD-10-CM | POA: Diagnosis not present

## 2019-08-20 DIAGNOSIS — I10 Essential (primary) hypertension: Secondary | ICD-10-CM | POA: Diagnosis not present

## 2019-08-20 DIAGNOSIS — Z79899 Other long term (current) drug therapy: Secondary | ICD-10-CM | POA: Diagnosis not present

## 2019-08-20 DIAGNOSIS — M25562 Pain in left knee: Secondary | ICD-10-CM | POA: Diagnosis not present

## 2019-08-20 DIAGNOSIS — Z5181 Encounter for therapeutic drug level monitoring: Secondary | ICD-10-CM | POA: Diagnosis not present

## 2019-08-20 DIAGNOSIS — G8929 Other chronic pain: Secondary | ICD-10-CM | POA: Diagnosis not present

## 2019-08-20 DIAGNOSIS — M25552 Pain in left hip: Secondary | ICD-10-CM | POA: Diagnosis not present

## 2019-08-21 DIAGNOSIS — M25552 Pain in left hip: Secondary | ICD-10-CM | POA: Diagnosis not present

## 2019-08-21 DIAGNOSIS — G894 Chronic pain syndrome: Secondary | ICD-10-CM | POA: Diagnosis not present

## 2019-08-21 DIAGNOSIS — M47816 Spondylosis without myelopathy or radiculopathy, lumbar region: Secondary | ICD-10-CM | POA: Diagnosis not present

## 2019-08-21 DIAGNOSIS — M5416 Radiculopathy, lumbar region: Secondary | ICD-10-CM | POA: Diagnosis not present

## 2019-08-21 DIAGNOSIS — M1612 Unilateral primary osteoarthritis, left hip: Secondary | ICD-10-CM | POA: Diagnosis not present

## 2019-08-21 DIAGNOSIS — Z1389 Encounter for screening for other disorder: Secondary | ICD-10-CM | POA: Diagnosis not present

## 2019-08-21 DIAGNOSIS — M545 Low back pain: Secondary | ICD-10-CM | POA: Diagnosis not present

## 2019-08-21 DIAGNOSIS — Z79891 Long term (current) use of opiate analgesic: Secondary | ICD-10-CM | POA: Diagnosis not present

## 2019-09-24 DIAGNOSIS — M545 Low back pain: Secondary | ICD-10-CM | POA: Diagnosis not present

## 2019-09-24 DIAGNOSIS — M25552 Pain in left hip: Secondary | ICD-10-CM | POA: Diagnosis not present

## 2019-09-24 DIAGNOSIS — M1612 Unilateral primary osteoarthritis, left hip: Secondary | ICD-10-CM | POA: Diagnosis not present

## 2019-09-24 DIAGNOSIS — G894 Chronic pain syndrome: Secondary | ICD-10-CM | POA: Diagnosis not present

## 2019-09-24 DIAGNOSIS — M5416 Radiculopathy, lumbar region: Secondary | ICD-10-CM | POA: Diagnosis not present

## 2019-09-24 DIAGNOSIS — Z1389 Encounter for screening for other disorder: Secondary | ICD-10-CM | POA: Diagnosis not present

## 2019-09-24 DIAGNOSIS — M47816 Spondylosis without myelopathy or radiculopathy, lumbar region: Secondary | ICD-10-CM | POA: Diagnosis not present

## 2019-09-24 DIAGNOSIS — Z79891 Long term (current) use of opiate analgesic: Secondary | ICD-10-CM | POA: Diagnosis not present

## 2019-10-01 DIAGNOSIS — H10501 Unspecified blepharoconjunctivitis, right eye: Secondary | ICD-10-CM | POA: Diagnosis not present

## 2019-10-19 DIAGNOSIS — Z6836 Body mass index (BMI) 36.0-36.9, adult: Secondary | ICD-10-CM | POA: Diagnosis not present

## 2019-10-19 DIAGNOSIS — I10 Essential (primary) hypertension: Secondary | ICD-10-CM | POA: Diagnosis not present

## 2019-10-19 DIAGNOSIS — Z5181 Encounter for therapeutic drug level monitoring: Secondary | ICD-10-CM | POA: Diagnosis not present

## 2019-10-19 DIAGNOSIS — Z79899 Other long term (current) drug therapy: Secondary | ICD-10-CM | POA: Diagnosis not present

## 2019-10-19 DIAGNOSIS — G8929 Other chronic pain: Secondary | ICD-10-CM | POA: Diagnosis not present

## 2019-11-15 DIAGNOSIS — M1612 Unilateral primary osteoarthritis, left hip: Secondary | ICD-10-CM | POA: Diagnosis not present

## 2019-11-15 DIAGNOSIS — Z1389 Encounter for screening for other disorder: Secondary | ICD-10-CM | POA: Diagnosis not present

## 2019-11-15 DIAGNOSIS — M47816 Spondylosis without myelopathy or radiculopathy, lumbar region: Secondary | ICD-10-CM | POA: Diagnosis not present

## 2019-11-15 DIAGNOSIS — M5416 Radiculopathy, lumbar region: Secondary | ICD-10-CM | POA: Diagnosis not present

## 2019-11-15 DIAGNOSIS — M5136 Other intervertebral disc degeneration, lumbar region: Secondary | ICD-10-CM | POA: Diagnosis not present

## 2019-11-15 DIAGNOSIS — Z79891 Long term (current) use of opiate analgesic: Secondary | ICD-10-CM | POA: Diagnosis not present

## 2019-11-15 DIAGNOSIS — M545 Low back pain: Secondary | ICD-10-CM | POA: Diagnosis not present

## 2019-11-15 DIAGNOSIS — M25552 Pain in left hip: Secondary | ICD-10-CM | POA: Diagnosis not present

## 2019-11-15 DIAGNOSIS — G894 Chronic pain syndrome: Secondary | ICD-10-CM | POA: Diagnosis not present

## 2019-12-13 DIAGNOSIS — M5136 Other intervertebral disc degeneration, lumbar region: Secondary | ICD-10-CM | POA: Diagnosis not present

## 2019-12-13 DIAGNOSIS — Z1389 Encounter for screening for other disorder: Secondary | ICD-10-CM | POA: Diagnosis not present

## 2019-12-13 DIAGNOSIS — M545 Low back pain: Secondary | ICD-10-CM | POA: Diagnosis not present

## 2019-12-13 DIAGNOSIS — M25552 Pain in left hip: Secondary | ICD-10-CM | POA: Diagnosis not present

## 2019-12-13 DIAGNOSIS — M5416 Radiculopathy, lumbar region: Secondary | ICD-10-CM | POA: Diagnosis not present

## 2019-12-13 DIAGNOSIS — M47816 Spondylosis without myelopathy or radiculopathy, lumbar region: Secondary | ICD-10-CM | POA: Diagnosis not present

## 2019-12-13 DIAGNOSIS — G894 Chronic pain syndrome: Secondary | ICD-10-CM | POA: Diagnosis not present

## 2019-12-13 DIAGNOSIS — Z79891 Long term (current) use of opiate analgesic: Secondary | ICD-10-CM | POA: Diagnosis not present

## 2019-12-13 DIAGNOSIS — M1612 Unilateral primary osteoarthritis, left hip: Secondary | ICD-10-CM | POA: Diagnosis not present

## 2019-12-14 DIAGNOSIS — Z5181 Encounter for therapeutic drug level monitoring: Secondary | ICD-10-CM | POA: Diagnosis not present

## 2019-12-14 DIAGNOSIS — M25562 Pain in left knee: Secondary | ICD-10-CM | POA: Diagnosis not present

## 2019-12-14 DIAGNOSIS — M25559 Pain in unspecified hip: Secondary | ICD-10-CM | POA: Diagnosis not present

## 2019-12-14 DIAGNOSIS — Z79899 Other long term (current) drug therapy: Secondary | ICD-10-CM | POA: Diagnosis not present

## 2020-01-01 DIAGNOSIS — M1612 Unilateral primary osteoarthritis, left hip: Secondary | ICD-10-CM | POA: Diagnosis not present

## 2020-01-07 DIAGNOSIS — G894 Chronic pain syndrome: Secondary | ICD-10-CM | POA: Diagnosis not present

## 2020-01-07 DIAGNOSIS — Z6835 Body mass index (BMI) 35.0-35.9, adult: Secondary | ICD-10-CM | POA: Diagnosis not present

## 2020-01-07 DIAGNOSIS — M25552 Pain in left hip: Secondary | ICD-10-CM | POA: Diagnosis not present

## 2020-01-07 DIAGNOSIS — I10 Essential (primary) hypertension: Secondary | ICD-10-CM | POA: Diagnosis not present

## 2020-01-07 DIAGNOSIS — Z5181 Encounter for therapeutic drug level monitoring: Secondary | ICD-10-CM | POA: Diagnosis not present

## 2020-01-07 DIAGNOSIS — Z79899 Other long term (current) drug therapy: Secondary | ICD-10-CM | POA: Diagnosis not present

## 2020-01-11 DIAGNOSIS — M1612 Unilateral primary osteoarthritis, left hip: Secondary | ICD-10-CM | POA: Diagnosis not present

## 2020-01-29 DIAGNOSIS — N138 Other obstructive and reflux uropathy: Secondary | ICD-10-CM | POA: Diagnosis not present

## 2020-01-29 DIAGNOSIS — R430 Anosmia: Secondary | ICD-10-CM | POA: Diagnosis not present

## 2020-01-29 DIAGNOSIS — N401 Enlarged prostate with lower urinary tract symptoms: Secondary | ICD-10-CM | POA: Diagnosis not present

## 2020-02-06 DIAGNOSIS — R3911 Hesitancy of micturition: Secondary | ICD-10-CM | POA: Diagnosis not present

## 2020-02-06 DIAGNOSIS — G894 Chronic pain syndrome: Secondary | ICD-10-CM | POA: Diagnosis not present

## 2020-02-06 DIAGNOSIS — M25552 Pain in left hip: Secondary | ICD-10-CM | POA: Diagnosis not present

## 2020-02-06 DIAGNOSIS — I10 Essential (primary) hypertension: Secondary | ICD-10-CM | POA: Diagnosis not present

## 2020-02-06 DIAGNOSIS — Z5181 Encounter for therapeutic drug level monitoring: Secondary | ICD-10-CM | POA: Diagnosis not present

## 2020-02-06 DIAGNOSIS — N401 Enlarged prostate with lower urinary tract symptoms: Secondary | ICD-10-CM | POA: Diagnosis not present

## 2020-02-06 DIAGNOSIS — Z79899 Other long term (current) drug therapy: Secondary | ICD-10-CM | POA: Diagnosis not present

## 2020-02-06 DIAGNOSIS — Z6836 Body mass index (BMI) 36.0-36.9, adult: Secondary | ICD-10-CM | POA: Diagnosis not present

## 2020-03-05 DIAGNOSIS — M25552 Pain in left hip: Secondary | ICD-10-CM | POA: Diagnosis not present

## 2020-03-05 DIAGNOSIS — G894 Chronic pain syndrome: Secondary | ICD-10-CM | POA: Diagnosis not present

## 2020-03-05 DIAGNOSIS — Z6835 Body mass index (BMI) 35.0-35.9, adult: Secondary | ICD-10-CM | POA: Diagnosis not present

## 2020-03-05 DIAGNOSIS — Z79899 Other long term (current) drug therapy: Secondary | ICD-10-CM | POA: Diagnosis not present

## 2020-03-05 DIAGNOSIS — Z5181 Encounter for therapeutic drug level monitoring: Secondary | ICD-10-CM | POA: Diagnosis not present

## 2020-03-05 DIAGNOSIS — I10 Essential (primary) hypertension: Secondary | ICD-10-CM | POA: Diagnosis not present

## 2020-03-06 DIAGNOSIS — M1612 Unilateral primary osteoarthritis, left hip: Secondary | ICD-10-CM | POA: Diagnosis not present

## 2020-04-02 DIAGNOSIS — G894 Chronic pain syndrome: Secondary | ICD-10-CM | POA: Diagnosis not present

## 2020-04-02 DIAGNOSIS — Z5181 Encounter for therapeutic drug level monitoring: Secondary | ICD-10-CM | POA: Diagnosis not present

## 2020-04-02 DIAGNOSIS — Z6835 Body mass index (BMI) 35.0-35.9, adult: Secondary | ICD-10-CM | POA: Diagnosis not present

## 2020-04-02 DIAGNOSIS — M25552 Pain in left hip: Secondary | ICD-10-CM | POA: Diagnosis not present

## 2020-04-02 DIAGNOSIS — Z79899 Other long term (current) drug therapy: Secondary | ICD-10-CM | POA: Diagnosis not present

## 2020-04-02 DIAGNOSIS — I10 Essential (primary) hypertension: Secondary | ICD-10-CM | POA: Diagnosis not present

## 2020-04-25 DIAGNOSIS — G894 Chronic pain syndrome: Secondary | ICD-10-CM | POA: Diagnosis not present

## 2020-04-25 DIAGNOSIS — I10 Essential (primary) hypertension: Secondary | ICD-10-CM | POA: Diagnosis not present

## 2020-04-25 DIAGNOSIS — M25552 Pain in left hip: Secondary | ICD-10-CM | POA: Diagnosis not present

## 2020-04-25 DIAGNOSIS — Z6836 Body mass index (BMI) 36.0-36.9, adult: Secondary | ICD-10-CM | POA: Diagnosis not present

## 2020-05-20 DIAGNOSIS — M25552 Pain in left hip: Secondary | ICD-10-CM | POA: Diagnosis not present

## 2020-05-20 DIAGNOSIS — G894 Chronic pain syndrome: Secondary | ICD-10-CM | POA: Diagnosis not present

## 2020-05-20 DIAGNOSIS — I10 Essential (primary) hypertension: Secondary | ICD-10-CM | POA: Diagnosis not present

## 2020-05-21 DIAGNOSIS — M1712 Unilateral primary osteoarthritis, left knee: Secondary | ICD-10-CM | POA: Diagnosis not present

## 2020-05-21 DIAGNOSIS — M25562 Pain in left knee: Secondary | ICD-10-CM | POA: Diagnosis not present

## 2020-05-30 DIAGNOSIS — M1612 Unilateral primary osteoarthritis, left hip: Secondary | ICD-10-CM | POA: Diagnosis not present

## 2020-07-08 DIAGNOSIS — G894 Chronic pain syndrome: Secondary | ICD-10-CM | POA: Diagnosis not present

## 2020-07-08 DIAGNOSIS — I10 Essential (primary) hypertension: Secondary | ICD-10-CM | POA: Diagnosis not present

## 2020-07-08 DIAGNOSIS — M25552 Pain in left hip: Secondary | ICD-10-CM | POA: Diagnosis not present

## 2020-07-24 DIAGNOSIS — J90 Pleural effusion, not elsewhere classified: Secondary | ICD-10-CM | POA: Insufficient documentation

## 2020-07-25 ENCOUNTER — Ambulatory Visit (INDEPENDENT_AMBULATORY_CARE_PROVIDER_SITE_OTHER): Payer: Managed Care, Other (non HMO) | Admitting: Cardiology

## 2020-07-25 ENCOUNTER — Other Ambulatory Visit: Payer: Self-pay

## 2020-07-25 ENCOUNTER — Encounter: Payer: Self-pay | Admitting: Cardiology

## 2020-07-25 VITALS — BP 124/88 | HR 84 | Ht 78.0 in | Wt 319.2 lb

## 2020-07-25 DIAGNOSIS — I959 Hypotension, unspecified: Secondary | ICD-10-CM

## 2020-07-25 DIAGNOSIS — Z6841 Body Mass Index (BMI) 40.0 and over, adult: Secondary | ICD-10-CM | POA: Diagnosis not present

## 2020-07-25 DIAGNOSIS — R002 Palpitations: Secondary | ICD-10-CM

## 2020-07-25 DIAGNOSIS — I10 Essential (primary) hypertension: Secondary | ICD-10-CM

## 2020-07-25 MED ORDER — METOPROLOL TARTRATE 25 MG PO TABS: 25.0000 mg | ORAL_TABLET | Freq: Four times a day (QID) | ORAL | 3 refills | Status: DC | PRN

## 2020-07-25 NOTE — Progress Notes (Deleted)
Cardiology Office Note:    Date:  07/25/2020   ID:  Carl Edwards, DOB Oct 17, 1971, MRN [id number]  PCP:  Garwin Brothers, MD  Cardiologist:  Shirlee More, MD    Referring MD: Garwin Brothers, MD    ASSESSMENT:    No diagnosis found. PLAN:    In order of problems listed above:  1. ***   Next appointment: ***   Medication Adjustments/Labs and Tests Ordered: Current medicines are reviewed at length with the patient today.  Concerns regarding medicines are outlined above.  No orders of the defined types were placed in this encounter.  No orders of the defined types were placed in this encounter.   No chief complaint on file.   History of Present Illness:    Carl Edwards is a 49 y.o. male with a hx of hypertension, symptomatic orthostatic hypotension following bariatric surgery last seen 11/13/2018.  He has had bariatric surgery with a gastric sleeve for morbid obesity. Compliance with diet, lifestyle and medications: *** Past Medical History:  Diagnosis Date  . Acute hypoxemic respiratory failure ([place]) 06/12/2018  . Anxiety 03/24/2017  . Bloody pleural effusion 06/12/2018  . Chest pain in adult 11/13/2018  . Complex partial seizure disorder ([place]) 04/19/2014  . Depression 03/24/2017  . Diverticulosis 03/24/2017  . Hypertension   . Knee pain, bilateral 03/24/2017  . Migraine 04/19/2014  . Morbid obesity with BMI of 40.0-44.9, adult ([place]) 12/06/2017  . Nephrolithiasis 03/24/2017  . Obstructive sleep apnea 04/19/2014  . Pituitary tumor 06/13/2014  . Pleural effusion on right   . Postoperative intestinal malabsorption 06/12/2018  . Posttraumatic arthropathy 01/20/2015  . Restless legs syndrome 08/21/2014  . S/P laparoscopic sleeve gastrectomy 06/12/2018  . Shortness of breath on exertion 08/03/2018    Past Surgical History:  Procedure Laterality Date  . APPENDECTOMY    . CARPAL TUNNEL RELEASE Left   . COLON RESECTION    . GASTRIC BYPASS    . HERNIA REPAIR    . KNEE SURGERY Bilateral   .  LITHOTRIPSY      Current Medications: No outpatient medications have been marked as taking for the 07/25/20 encounter (Appointment) with Richardo Priest, MD.     Allergies:   Acetaminophen   Social History   Socioeconomic History  . Marital status: Married    Spouse name: Not on file  . Number of children: Not on file  . Years of education: Not on file  . Highest education level: Not on file  Occupational History  . Not on file  Tobacco Use  . Smoking status: Never Smoker  . Smokeless tobacco: Former Network engineer  . Vaping Use: Never used  Substance and Sexual Activity  . Alcohol use: Not Currently  . Drug use: Not Currently  . Sexual activity: Not on file  Other Topics Concern  . Not on file  Social History Narrative  . Not on file   Social Determinants of Health   Financial Resource Strain:   . Difficulty of Paying Living Expenses: Not on file  Food Insecurity:   . Worried About Charity fundraiser in the Last Year: Not on file  . Ran Out of Food in the Last Year: Not on file  Transportation Needs:   . Lack of Transportation (Medical): Not on file  . Lack of Transportation (Non-Medical): Not on file  Physical Activity:   . Days of Exercise per Week: Not on file  . Minutes of Exercise per Session: Not  on file  Stress:   . Feeling of Stress : Not on file  Social Connections:   . Frequency of Communication with Friends and Family: Not on file  . Frequency of Social Gatherings with Friends and Family: Not on file  . Attends Religious Services: Not on file  . Active Member of Clubs or Organizations: Not on file  . Attends Archivist Meetings: Not on file  . Marital Status: Not on file     Family History: The patient's ***family history includes Heart Problems in his mother; Lung cancer in his father and maternal grandfather; Pulmonary embolism in his mother. ROS:   Please see the history of present illness.    All other systems reviewed and are  negative.  EKGs/Labs/Other Studies Reviewed:    The following studies were reviewed today:  EKG:  EKG ordered today and personally reviewed.  The ekg ordered today demonstrates ***  Recent Labs: No results found for requested labs within last 8760 hours.  Recent Lipid Panel No results found for: CHOL, TRIG, HDL, CHOLHDL, VLDL, LDLCALC, LDLDIRECT  Physical Exam:    VS:  There were no vitals taken for this visit.    Wt Readings from Last 3 Encounters:  12/12/18 (!) 305 lb 6.4 oz (138.5 kg)  11/13/18 (!) 312 lb (141.5 kg)  08/02/18 (!) 324 lb (147 kg)     GEN: *** Well nourished, well developed in no acute distress HEENT: Normal NECK: No JVD; No carotid bruits LYMPHATICS: No lymphadenopathy CARDIAC: ***RRR, no murmurs, rubs, gallops RESPIRATORY:  Clear to auscultation without rales, wheezing or rhonchi  ABDOMEN: Soft, non-tender, non-distended MUSCULOSKELETAL:  No edema; No deformity  SKIN: Warm and dry NEUROLOGIC:  Alert and oriented x 3 PSYCHIATRIC:  Normal affect    Signed, Shirlee More, MD  07/25/2020 7:48 AM    [place]

## 2020-07-25 NOTE — Patient Instructions (Signed)
Medication Instructions:  Your physician has recommended you make the following change in your medication:  START: Lopressor 25 mg take one tablet by mouth every 6 hours as needed for rapid heart rate.  *If you need a refill on your cardiac medications before your next appointment, please call your pharmacy*   Lab Work: None If you have labs (blood work) drawn today and your tests are completely normal, you will receive your results only by: Marland Kitchen MyChart Message (if you have MyChart) OR . A paper copy in the mail If you have any lab test that is abnormal or we need to change your treatment, we will call you to review the results.   Testing/Procedures: None   Follow-Up: At [facility], you and your health needs are our priority.  As part of our continuing mission to provide you with exceptional heart care, we have created designated Provider Care Teams.  These Care Teams include your primary Cardiologist (physician) and Advanced Practice Providers (APPs -  Physician Assistants and Nurse Practitioners) who all work together to provide you with the care you need, when you need it.  We recommend signing up for the patient portal called "MyChart".  Sign up information is provided on this After Visit Summary.  MyChart is used to connect with patients for Virtual Visits (Telemedicine).  Patients are able to view lab/test results, encounter notes, upcoming appointments, etc.  Non-urgent messages can be sent to your provider as well.   To learn more about what you can do with MyChart, go to [email or web address].    Your next appointment:   3 month(s)  The format for your next appointment:   In Person  Provider:   Shirlee More, MD   Other Instructions

## 2020-07-25 NOTE — Progress Notes (Addendum)
Cardiology Office Note:    Date:  07/26/2020   ID:  Carl Edwards, DOB 1971-01-08, MRN [id number]  PCP:  Garwin Brothers, MD  Cardiologist:  Shirlee More, MD    Referring MD: Garwin Brothers, MD    ASSESSMENT:    1. Palpitations   2. Essential hypertension   3. Morbid obesity with BMI of 40.0-44.9, adult ([place])   4. Hypotension, unspecified hypotension type    PLAN:    In order of problems listed above:  1. His episode is quite suggestive of atrial fibrillation I offered him an ambulatory event monitor he said he is done it before it has not helped and he prefers to buy the iPhone adapter I will see in 3 months and if he captures a rhythm of atrial fibrillation he will communicate with me through my chart.  His wife is an Therapist, sports and he has healthcare literacy and capable of doing it. 2. Stable continue ACE inhibitor hydrochlorothiazide 3. Improved with bariatric surgery 4. Resolved   Next appointment: 3 months   Medication Adjustments/Labs and Tests Ordered: Current medicines are reviewed at length with the patient today.  Concerns regarding medicines are outlined above.  Orders Placed This Encounter  Procedures  . EKG 12-Lead   Meds ordered this encounter  Medications  . metoprolol tartrate (LOPRESSOR) 25 MG tablet    Sig: Take 1 tablet (25 mg total) by mouth every 6 (six) hours as needed (As needed for rapid heart rate).    Dispense:  180 tablet    Refill:  3    Chief Complaint  Patient presents with  . Follow-up    Recent episode of rapid heart rhythm lasted 30 minutes his wife told him it was irregular and needed to see cardiology    History of Present Illness:    Carl Edwards is a 49 y.o. male with a hx of hypertension, symptomatic orthostatic hypotension initially requiring Florinef and midodrine with syncope following bariatric surgery last seen 11/13/2018.  He has had bariatric surgery with a gastric sleeve for morbid obesity. Compliance with diet, lifestyle and  medications: Yes  He seeks my attention because an episode recently where his heart race for 30 minutes his wife is a nurse told him was irregular and to see cardiology.  His EKG shows APCs he is at risk for atrial fibrillation with sleep apnea does not want to wear a event monitor again and will purchase the iPhone adapter to record his heart rhythm at home.  Recent CMP at his primary care physician's office was normal including potassium I reviewed off his iPhone.  He is less active during Covid but has lost over 90 pounds after bariatric surgery and has had some aches and pains in the left shoulder and left hand.  No chest pain shortness of breath or syncope Past Medical History:  Diagnosis Date  . Acute hypoxemic respiratory failure ([place]) 06/12/2018  . Anxiety 03/24/2017  . Bloody pleural effusion 06/12/2018  . Chest pain in adult 11/13/2018  . Complex partial seizure disorder ([place]) 04/19/2014  . Depression 03/24/2017  . Diverticulosis 03/24/2017  . Hypertension   . Knee pain, bilateral 03/24/2017  . Migraine 04/19/2014  . Morbid obesity with BMI of 40.0-44.9, adult ([place]) 12/06/2017  . Nephrolithiasis 03/24/2017  . Obstructive sleep apnea 04/19/2014  . Pituitary tumor 06/13/2014  . Pleural effusion on right   . Postoperative intestinal malabsorption 06/12/2018  . Posttraumatic arthropathy 01/20/2015  . Restless legs syndrome 08/21/2014  . S/P  laparoscopic sleeve gastrectomy 06/12/2018  . Shortness of breath on exertion 08/03/2018    Past Surgical History:  Procedure Laterality Date  . APPENDECTOMY    . CARPAL TUNNEL RELEASE Left   . COLON RESECTION    . GASTRIC BYPASS    . HERNIA REPAIR    . KNEE SURGERY Bilateral   . LITHOTRIPSY      Current Medications: Current Meds  Medication Sig  . albuterol (VENTOLIN HFA) 108 (90 Base) MCG/ACT inhaler Inhale 1-2 puffs into the lungs every 6 (six) hours as needed for wheezing or shortness of breath.  Marland Kitchen buPROPion (WELLBUTRIN XL) 150 MG 24 hr tablet Take  150 mg by mouth daily.  Marland Kitchen buPROPion (WELLBUTRIN XL) 300 MG 24 hr tablet Take 300 mg by mouth daily.  . Coenzyme Q10 (CO Q-10) 50 MG CAPS Take 1 capsule by mouth at bedtime.  . fluticasone (FLONASE) 50 MCG/ACT nasal spray use 1 spray in each nostril daily as needed for congestion/allergies  . lisinopril-hydrochlorothiazide (ZESTORETIC) 10-12.5 MG tablet Take 1 tablet by mouth daily.  . Multiple Vitamin (MULTIVITAMIN) tablet Take 2 tablets by mouth daily.  Marland Kitchen omeprazole (PRILOSEC) 40 MG capsule Take 40 mg by mouth daily as needed.   . Oxycodone HCl 10 MG TABS Take 10 mg by mouth every 6 (six) hours as needed.  . tamsulosin (FLOMAX) 0.4 MG CAPS capsule Take 0.4 mg by mouth daily.     Allergies:   Acetaminophen   Social History   Socioeconomic History  . Marital status: Married    Spouse name: Not on file  . Number of children: Not on file  . Years of education: Not on file  . Highest education level: Not on file  Occupational History  . Not on file  Tobacco Use  . Smoking status: Never Smoker  . Smokeless tobacco: Former Network engineer  . Vaping Use: Never used  Substance and Sexual Activity  . Alcohol use: Not Currently  . Drug use: Not Currently  . Sexual activity: Not on file  Other Topics Concern  . Not on file  Social History Narrative  . Not on file   Social Determinants of Health   Financial Resource Strain:   . Difficulty of Paying Living Expenses: Not on file  Food Insecurity:   . Worried About Charity fundraiser in the Last Year: Not on file  . Ran Out of Food in the Last Year: Not on file  Transportation Needs:   . Lack of Transportation (Medical): Not on file  . Lack of Transportation (Non-Medical): Not on file  Physical Activity:   . Days of Exercise per Week: Not on file  . Minutes of Exercise per Session: Not on file  Stress:   . Feeling of Stress : Not on file  Social Connections:   . Frequency of Communication with Friends and Family: Not on file    . Frequency of Social Gatherings with Friends and Family: Not on file  . Attends Religious Services: Not on file  . Active Member of Clubs or Organizations: Not on file  . Attends Archivist Meetings: Not on file  . Marital Status: Not on file     Family History: The patient's family history includes Heart Problems in his mother; Lung cancer in his father and maternal grandfather; Pulmonary embolism in his mother. ROS:   Please see the history of present illness.    All other systems reviewed and are negative.  EKGs/Labs/Other Studies  Reviewed:    The following studies were reviewed today:  EKG:  EKG ordered today and personally reviewed.  The ekg ordered today demonstrates sinus rhythm with frequent APCs  Recent Labs: No results found for requested labs within last 8760 hours.  Recent Lipid Panel No results found for: CHOL, TRIG, HDL, CHOLHDL, VLDL, LDLCALC, LDLDIRECT  Physical Exam:    VS:  BP 124/88   Pulse 84   Ht 6\' 6"  (1.981 m)   Wt (!) 319 lb 3.2 oz (144.8 kg)   SpO2 94%   BMI 36.89 kg/m     Wt Readings from Last 3 Encounters:  07/25/20 (!) 319 lb 3.2 oz (144.8 kg)  12/12/18 (!) 305 lb 6.4 oz (138.5 kg)  11/13/18 (!) 312 lb (141.5 kg)     GEN: Obese well nourished, well developed in no acute distress HEENT: Normal NECK: No JVD; No carotid bruits LYMPHATICS: No lymphadenopathy CARDIAC: RRR, no murmurs, rubs, gallops RESPIRATORY:  Clear to auscultation without rales, wheezing or rhonchi  ABDOMEN: Soft, non-tender, non-distended MUSCULOSKELETAL:  No edema; No deformity  SKIN: Warm and dry NEUROLOGIC:  Alert and oriented x 3 PSYCHIATRIC:  Normal affect    Signed, Shirlee More, MD  07/26/2020 12:42 PM    [place]

## 2020-08-27 DIAGNOSIS — G894 Chronic pain syndrome: Secondary | ICD-10-CM | POA: Diagnosis not present

## 2020-08-27 DIAGNOSIS — I10 Essential (primary) hypertension: Secondary | ICD-10-CM | POA: Diagnosis not present

## 2020-08-27 DIAGNOSIS — M25552 Pain in left hip: Secondary | ICD-10-CM | POA: Diagnosis not present

## 2020-08-28 DIAGNOSIS — M1612 Unilateral primary osteoarthritis, left hip: Secondary | ICD-10-CM | POA: Diagnosis not present

## 2020-09-08 DIAGNOSIS — M1612 Unilateral primary osteoarthritis, left hip: Secondary | ICD-10-CM | POA: Diagnosis not present

## 2020-09-11 DIAGNOSIS — L820 Inflamed seborrheic keratosis: Secondary | ICD-10-CM | POA: Diagnosis not present

## 2020-09-11 DIAGNOSIS — L918 Other hypertrophic disorders of the skin: Secondary | ICD-10-CM | POA: Diagnosis not present

## 2020-10-22 DIAGNOSIS — M25552 Pain in left hip: Secondary | ICD-10-CM | POA: Diagnosis not present

## 2020-10-22 DIAGNOSIS — I10 Essential (primary) hypertension: Secondary | ICD-10-CM | POA: Diagnosis not present

## 2020-10-22 DIAGNOSIS — G4700 Insomnia, unspecified: Secondary | ICD-10-CM | POA: Diagnosis not present

## 2020-10-22 DIAGNOSIS — G894 Chronic pain syndrome: Secondary | ICD-10-CM | POA: Diagnosis not present

## 2020-10-28 DIAGNOSIS — Z79899 Other long term (current) drug therapy: Secondary | ICD-10-CM | POA: Diagnosis not present

## 2020-10-28 DIAGNOSIS — Z5181 Encounter for therapeutic drug level monitoring: Secondary | ICD-10-CM | POA: Diagnosis not present

## 2020-10-28 DIAGNOSIS — G894 Chronic pain syndrome: Secondary | ICD-10-CM | POA: Diagnosis not present

## 2020-10-29 ENCOUNTER — Ambulatory Visit: Payer: Managed Care, Other (non HMO) | Admitting: Cardiology

## 2020-11-13 DIAGNOSIS — M1612 Unilateral primary osteoarthritis, left hip: Secondary | ICD-10-CM | POA: Diagnosis not present

## 2020-11-17 ENCOUNTER — Telehealth: Payer: Self-pay | Admitting: Cardiology

## 2020-11-17 NOTE — Telephone Encounter (Signed)
No I would not do a myoview at this time

## 2020-11-17 NOTE — Telephone Encounter (Signed)
Dr. Bettina Gavia You saw this patient on 07/25/20 and ordered a heart monitor. Heart monitor did not show Afib, but it appears he has a nuclear stress test ordered that has not been completed. We have been asked for clearance for hip replacement. Do you recommend completing the stress test prior to surgery?

## 2020-11-17 NOTE — Telephone Encounter (Signed)
° °  [facility] Pre-operative Risk Assessment    HEARTCARE STAFF: - Please ensure there is not already an duplicate clearance open for this procedure. - Under Visit Info/Reason for Call, type in Other and utilize the format Clearance MM/DD/YY or Clearance TBD. Do not use dashes or single digits. - If request is for dental extraction, please clarify the # of teeth to be extracted.  Request for surgical clearance:  1. What type of surgery is being performed? L Hip Replacement  2. When is this surgery scheduled? TBD  3. What type of clearance is required (medical clearance vs. Pharmacy clearance to hold med vs. Both)? Medical  4. Are there any medications that need to be held prior to surgery and how long? no  5. Practice name and name of physician performing surgery? Dr. Creig Hines, [place]     6. What is the office phone number? [phone] x [phone]   7.   What is the office fax number? [phone]  8.   Anesthesia type (None, local, MAC, general) ? General   Carl Edwards 11/17/2020, 2:58 PM  _________________________________________________________________   (provider comments below)

## 2020-11-19 DIAGNOSIS — M25562 Pain in left knee: Secondary | ICD-10-CM | POA: Diagnosis not present

## 2020-11-19 DIAGNOSIS — G8929 Other chronic pain: Secondary | ICD-10-CM | POA: Diagnosis not present

## 2020-11-19 DIAGNOSIS — K219 Gastro-esophageal reflux disease without esophagitis: Secondary | ICD-10-CM | POA: Diagnosis not present

## 2020-11-19 DIAGNOSIS — M25552 Pain in left hip: Secondary | ICD-10-CM | POA: Diagnosis not present

## 2020-11-21 NOTE — Telephone Encounter (Signed)
It appears patient already have a scheduled preoperative clearance appointment with Dr. Bettina Gavia in February, will defer final clearance to MD.

## 2020-12-16 DIAGNOSIS — M25552 Pain in left hip: Secondary | ICD-10-CM | POA: Diagnosis not present

## 2020-12-16 DIAGNOSIS — G4701 Insomnia due to medical condition: Secondary | ICD-10-CM | POA: Diagnosis not present

## 2020-12-16 DIAGNOSIS — M25562 Pain in left knee: Secondary | ICD-10-CM | POA: Diagnosis not present

## 2020-12-16 DIAGNOSIS — I10 Essential (primary) hypertension: Secondary | ICD-10-CM | POA: Diagnosis not present

## 2020-12-16 DIAGNOSIS — G8929 Other chronic pain: Secondary | ICD-10-CM | POA: Diagnosis not present

## 2020-12-16 DIAGNOSIS — K219 Gastro-esophageal reflux disease without esophagitis: Secondary | ICD-10-CM | POA: Diagnosis not present

## 2020-12-21 NOTE — Progress Notes (Unsigned)
Cardiology Office Note:    Date:  12/22/2020   ID:  Carl Edwards, DOB 1971/08/24, MRN [id number]  PCP:  Garwin Brothers, MD  Cardiologist:  Shirlee More, MD    Referring MD: Garwin Brothers, MD    ASSESSMENT:    1. Preoperative cardiovascular examination   2. Essential hypertension   3. Hypotension, unspecified hypotension type    PLAN:    In order of problems listed above:  1. His planned procedure is intermediate risk elective and from my perspective he is optimized and does not require repeat cardiology testing.  With his previous fall trauma and orthostatic hypotension after surgery I think he should remain in the hospital overnight.  I will leave decision regarding anesthesia to the anesthesiology team but they may choose to avoid spinal. 2. Overtreated reduce ACE inhibitor thiazide diuretic by 50% trend blood pressures and contact me if systolics remain less than 120 3. Severe postoperative orthostatic hypotension with previous surgery.   Next appointment: As needed   Medication Adjustments/Labs and Tests Ordered: Current medicines are reviewed at length with the patient today.  Concerns regarding medicines are outlined above.  Orders Placed This Encounter  Procedures  . EKG 12-Lead   No orders of the defined types were placed in this encounter.   Chief Complaint  Patient presents with  . Pre-op Exam    History of Present Illness:    Carl Edwards is a 50 y.o. male with a hx of hypertension and previous symptomatic orthostatic hypotension requiring Florinef and midodrine transient treatment following bariatric surgery and palpitation.  He was  last seen 07/25/2020. Compliance with diet, lifestyle and medications: Yes  Previous evaluation 2019 showed a 2-week extended event monitor without arrhythmia and echocardiogram showed normal left ventricular size and function.  He has the alive core cardia mobile I reviewed her monitor strip of his sinus rhythm normal. Home  blood pressure typically runs 1 72-6 30 systolic today repeat 203/55.  He is not lightheaded no chest pain edema palpitation.  He is due for a wellness exam with lab work in the next week with his PCP.  He has intolerable pain in his hip and is pending left total hip arthroplasty in the next few months. Following bariatric surgery had a fall when he was transferring and had trauma and subsequent severe orthostatic hypotension. He anticipates remaining overnight in the hospital. Past Medical History:  Diagnosis Date  . Acute hypoxemic respiratory failure ([place]) 06/12/2018  . Anxiety 03/24/2017  . Bloody pleural effusion 06/12/2018  . Chest pain in adult 11/13/2018  . Complex partial seizure disorder ([place]) 04/19/2014  . Depression 03/24/2017  . Diverticulosis 03/24/2017  . Hypertension   . Knee pain, bilateral 03/24/2017  . Migraine 04/19/2014  . Morbid obesity with BMI of 40.0-44.9, adult ([place]) 12/06/2017  . Nephrolithiasis 03/24/2017  . Obstructive sleep apnea 04/19/2014  . Pituitary tumor 06/13/2014  . Pleural effusion on right   . Postoperative intestinal malabsorption 06/12/2018  . Posttraumatic arthropathy 01/20/2015  . Restless legs syndrome 08/21/2014  . S/P laparoscopic sleeve gastrectomy 06/12/2018  . Shortness of breath on exertion 08/03/2018    Past Surgical History:  Procedure Laterality Date  . APPENDECTOMY    . CARPAL TUNNEL RELEASE Left   . COLON RESECTION    . GASTRIC BYPASS    . HERNIA REPAIR    . KNEE SURGERY Bilateral   . LITHOTRIPSY      Current Medications: Current Meds  Medication Sig  . albuterol (  VENTOLIN HFA) 108 (90 Base) MCG/ACT inhaler Inhale 1-2 puffs into the lungs every 6 (six) hours as needed for wheezing or shortness of breath.  . BELBUCA 300 MCG FILM Take 300 mg by mouth 2 (two) times daily.  Marland Kitchen buPROPion (WELLBUTRIN XL) 150 MG 24 hr tablet Take 150 mg by mouth daily.  Marland Kitchen buPROPion (WELLBUTRIN XL) 300 MG 24 hr tablet Take 300 mg by mouth daily.  . fluticasone  (FLONASE) 50 MCG/ACT nasal spray use 1 spray in each nostril daily as needed for congestion/allergies  . lisinopril-hydrochlorothiazide (ZESTORETIC) 10-12.5 MG tablet Take 0.5 tablets by mouth daily.  . metoprolol tartrate (LOPRESSOR) 25 MG tablet Take 1 tablet (25 mg total) by mouth every 6 (six) hours as needed (As needed for rapid heart rate).  Marland Kitchen omeprazole (PRILOSEC) 40 MG capsule Take 40 mg by mouth daily as needed.   Marland Kitchen oxyCODONE (ROXICODONE) 15 MG immediate release tablet Take 15 mg by mouth 4 (four) times daily as needed.  . tamsulosin (FLOMAX) 0.4 MG CAPS capsule Take 0.4 mg by mouth daily.  . traZODone (DESYREL) 150 MG tablet Take 150 mg by mouth at bedtime.     Allergies:   Acetaminophen   Social History   Socioeconomic History  . Marital status: Married    Spouse name: Not on file  . Number of children: Not on file  . Years of education: Not on file  . Highest education level: Not on file  Occupational History  . Not on file  Tobacco Use  . Smoking status: Never Smoker  . Smokeless tobacco: Former Network engineer  . Vaping Use: Never used  Substance and Sexual Activity  . Alcohol use: Not Currently  . Drug use: Not Currently  . Sexual activity: Not on file  Other Topics Concern  . Not on file  Social History Narrative  . Not on file   Social Determinants of Health   Financial Resource Strain: Not on file  Food Insecurity: Not on file  Transportation Needs: Not on file  Physical Activity: Not on file  Stress: Not on file  Social Connections: Not on file     Family History: The patient's family history includes Heart Problems in his mother; Lung cancer in his father and maternal grandfather; Pulmonary embolism in his mother. ROS:   Please see the history of present illness.    All other systems reviewed and are negative.  EKGs/Labs/Other Studies Reviewed:    The following studies were reviewed today:  EKG:  EKG ordered today and personally reviewed.   The ekg ordered today demonstrates sinus rhythm and is normal  Recent Labs: None 27 2020: Cholesterol 126 LDL 146 triglycerides 214 HDL 41 A1c 5.4%  Physical Exam:    VS:  BP 98/60   Pulse 76   Ht 6\' 6"  (1.981 m)   Wt (!) 313 lb 12.8 oz (142.3 kg)   SpO2 96%   BMI 36.26 kg/m     Wt Readings from Last 3 Encounters:  12/22/20 (!) 313 lb 12.8 oz (142.3 kg)  07/25/20 (!) 319 lb 3.2 oz (144.8 kg)  12/12/18 (!) 305 lb 6.4 oz (138.5 kg)     GEN:  Well nourished, well developed in no acute distress HEENT: Normal NECK: No JVD; No carotid bruits LYMPHATICS: No lymphadenopathy CARDIAC: RRR, no murmurs, rubs, gallops RESPIRATORY:  Clear to auscultation without rales, wheezing or rhonchi  ABDOMEN: Soft, non-tender, non-distended MUSCULOSKELETAL:  No edema; No deformity  SKIN: Warm and dry  NEUROLOGIC:  Alert and oriented x 3 PSYCHIATRIC:  Normal affect    Signed, Shirlee More, MD  12/22/2020 4:16 PM    [facility]

## 2020-12-22 ENCOUNTER — Encounter: Payer: Self-pay | Admitting: Cardiology

## 2020-12-22 ENCOUNTER — Other Ambulatory Visit: Payer: Self-pay

## 2020-12-22 ENCOUNTER — Ambulatory Visit (INDEPENDENT_AMBULATORY_CARE_PROVIDER_SITE_OTHER): Payer: Managed Care, Other (non HMO) | Admitting: Cardiology

## 2020-12-22 VITALS — BP 98/60 | HR 76 | Ht 78.0 in | Wt 313.8 lb

## 2020-12-22 DIAGNOSIS — Z01810 Encounter for preprocedural cardiovascular examination: Secondary | ICD-10-CM | POA: Diagnosis not present

## 2020-12-22 DIAGNOSIS — I10 Essential (primary) hypertension: Secondary | ICD-10-CM | POA: Diagnosis not present

## 2020-12-22 DIAGNOSIS — I959 Hypotension, unspecified: Secondary | ICD-10-CM

## 2020-12-22 NOTE — Patient Instructions (Signed)
Medication Instructions:  Your physician has recommended you make the following change in your medication:  DECREASE: Lisinopril/HCTZ  take 1/2 tablet daily.  *If you need a refill on your cardiac medications before your next appointment, please call your pharmacy*   Lab Work: None If you have labs (blood work) drawn today and your tests are completely normal, you will receive your results only by: Marland Kitchen MyChart Message (if you have MyChart) OR . A paper copy in the mail If you have any lab test that is abnormal or we need to change your treatment, we will call you to review the results.   Testing/Procedures: None   Follow-Up: At [facility], you and your health needs are our priority.  As part of our continuing mission to provide you with exceptional heart care, we have created designated Provider Care Teams.  These Care Teams include your primary Cardiologist (physician) and Advanced Practice Providers (APPs -  Physician Assistants and Nurse Practitioners) who all work together to provide you with the care you need, when you need it.  We recommend signing up for the patient portal called "MyChart".  Sign up information is provided on this After Visit Summary.  MyChart is used to connect with patients for Virtual Visits (Telemedicine).  Patients are able to view lab/test results, encounter notes, upcoming appointments, etc.  Non-urgent messages can be sent to your provider as well.   To learn more about what you can do with MyChart, go to [email or web address].    Your next appointment:   As needed  The format for your next appointment:   In Person  Provider:   Shirlee More, MD   Other Instructions Please check your blood pressure daily at home and call us if your top number is remaining below 120

## 2020-12-25 ENCOUNTER — Telehealth: Payer: Self-pay | Admitting: Cardiology

## 2020-12-25 NOTE — Telephone Encounter (Signed)
FollowUp:     Carl Edwards is calling to check on the status of this pt's clearance. Please fax asap to [phone].op

## 2020-12-25 NOTE — Telephone Encounter (Signed)
Dr. Joya Gaskins office note from 12/22/20 sent for clearance 12/25/20  Kathyrn Drown NP-C [facility] Pager: [phone]

## 2021-01-19 ENCOUNTER — Telehealth: Payer: Self-pay | Admitting: Cardiology

## 2021-01-19 NOTE — Telephone Encounter (Signed)
Seen by Dr Bettina Gavia 12/22/2020 but pre op clearance not clearly addressed.  I called the patient today- left message to call back.  Kerin Ransom PA-C 01/19/2021 10:59 AM

## 2021-01-19 NOTE — Telephone Encounter (Signed)
Follow Up:    .Ivin Booty from [employer] called. She said they still have not received clearance for this pt's surgery. Please fax asap to [phone].

## 2021-01-26 NOTE — Telephone Encounter (Signed)
   Primary Cardiologist: Shirlee More, MD  Chart reviewed as part of pre-operative protocol coverage.   Carl Edwards was last seen on 12/22/20 by Dr. Bettina Gavia with cardiac hx including HTN, orthostatic hypotension, obesity amongst other history outlined. At that visit Dr. Bettina Gavia assessed clearance and stated, "His planned procedure is intermediate risk elective and from my perspective he is optimized and does not require repeat cardiology testing.  With his previous fall trauma and orthostatic hypotension after surgery I think he should remain in the hospital overnight.  I will leave decision regarding anesthesia to the anesthesiology team but they may choose to avoid spinal."  Since it has been over a month since that visit, I reached out to the patient to ensure no change in status. LMTCB. Assuming no change from prior status will plan to bundle this rec and send onward.  Will route to callback team to call surgeon's office to let them know we have received their clearance request but are just waiting to hear back from the patient.  Charlie Pitter, PA-C 01/26/2021, 8:43 AM

## 2021-01-26 NOTE — Telephone Encounter (Signed)
Called and s/w Ivin Booty at [employer] and gave update our office has left a message for the pt to call back in regards to pre op assessment.

## 2021-02-05 NOTE — Telephone Encounter (Signed)
Left message to call back at 10:30 AM on 02/05/2021.  Patient is call back.

## 2021-02-09 NOTE — Telephone Encounter (Signed)
Left message to call back and ask to speak with pre-op team.  Darreld Mclean, PA-C 02/09/2021 3:05 PM

## 2021-02-17 NOTE — Telephone Encounter (Signed)
LVM to call back and ask for pre-op team

## 2021-02-26 NOTE — Telephone Encounter (Signed)
Left message for the patient to call back and speak to the preop APP.

## 2021-03-05 NOTE — Telephone Encounter (Signed)
Multiple attempts have been made to contact this patient.  Multiple voicemails have been left for patient to call back.  Due to lack of correspondence I will remove patient from preoperative pool.  Please contact requesting office for alternative phone number.  Thank you.  Jossie Ng. Tiaria Biby NP-C    03/05/2021, 12:54 PM [place] [address] Office [phone] Fax [phone]

## 2021-03-05 NOTE — Telephone Encounter (Signed)
Left message for Ivin Booty with [facility] orthopedic to please call our office [phone] to confirm if their office may have an alternate # for [place] to reach the pt. Left message we have tried numerous times to reach pt in regards to clearance. Will await a call back from [place].

## 2021-03-06 NOTE — Telephone Encounter (Signed)
Left message if someone from [facility] would call our office back. We have been unable to reach the pt in regards to the clearance request from orthopedics. Called [facility] to ask if they may have an alternate number on file that we may try to reach the pt. Left message to call back Monday and ask to s/w the pre op call back team.

## 2021-03-06 NOTE — Telephone Encounter (Signed)
Carl Edwards is calling stating Carl Edwards has decided to put a hold on getting this surgery at this time so he is no longer currently needing this clearance. Please advise.

## 2021-03-06 NOTE — Telephone Encounter (Signed)
See note from call from orthopedic. Pt is has put his surgery on gold at this time. Will close this out and remove from the pre op call back pool.

## 2021-03-10 DIAGNOSIS — I10 Essential (primary) hypertension: Secondary | ICD-10-CM | POA: Diagnosis not present

## 2021-03-10 DIAGNOSIS — Z1331 Encounter for screening for depression: Secondary | ICD-10-CM | POA: Diagnosis not present

## 2021-03-10 DIAGNOSIS — M25552 Pain in left hip: Secondary | ICD-10-CM | POA: Diagnosis not present

## 2021-03-10 DIAGNOSIS — F3342 Major depressive disorder, recurrent, in full remission: Secondary | ICD-10-CM | POA: Diagnosis not present

## 2021-03-10 DIAGNOSIS — G4701 Insomnia due to medical condition: Secondary | ICD-10-CM | POA: Diagnosis not present

## 2021-03-10 DIAGNOSIS — G8929 Other chronic pain: Secondary | ICD-10-CM | POA: Diagnosis not present

## 2021-03-10 DIAGNOSIS — M25562 Pain in left knee: Secondary | ICD-10-CM | POA: Diagnosis not present

## 2021-03-18 DIAGNOSIS — B349 Viral infection, unspecified: Secondary | ICD-10-CM | POA: Diagnosis not present

## 2021-03-18 DIAGNOSIS — J069 Acute upper respiratory infection, unspecified: Secondary | ICD-10-CM | POA: Diagnosis not present

## 2021-05-05 DIAGNOSIS — M25562 Pain in left knee: Secondary | ICD-10-CM | POA: Diagnosis not present

## 2021-05-05 DIAGNOSIS — M25552 Pain in left hip: Secondary | ICD-10-CM | POA: Diagnosis not present

## 2021-05-05 DIAGNOSIS — G8929 Other chronic pain: Secondary | ICD-10-CM | POA: Diagnosis not present

## 2021-05-05 DIAGNOSIS — I10 Essential (primary) hypertension: Secondary | ICD-10-CM | POA: Diagnosis not present

## 2021-07-03 DIAGNOSIS — M25562 Pain in left knee: Secondary | ICD-10-CM | POA: Diagnosis not present

## 2021-07-03 DIAGNOSIS — E782 Mixed hyperlipidemia: Secondary | ICD-10-CM | POA: Diagnosis not present

## 2021-07-03 DIAGNOSIS — I10 Essential (primary) hypertension: Secondary | ICD-10-CM | POA: Diagnosis not present

## 2021-07-03 DIAGNOSIS — G8929 Other chronic pain: Secondary | ICD-10-CM | POA: Diagnosis not present

## 2021-07-03 DIAGNOSIS — M25552 Pain in left hip: Secondary | ICD-10-CM | POA: Diagnosis not present

## 2021-07-03 DIAGNOSIS — K5903 Drug induced constipation: Secondary | ICD-10-CM | POA: Diagnosis not present

## 2021-07-27 DIAGNOSIS — Z20822 Contact with and (suspected) exposure to covid-19: Secondary | ICD-10-CM | POA: Diagnosis not present

## 2021-08-26 DIAGNOSIS — M79672 Pain in left foot: Secondary | ICD-10-CM | POA: Diagnosis not present

## 2021-08-31 DIAGNOSIS — M25552 Pain in left hip: Secondary | ICD-10-CM | POA: Diagnosis not present

## 2021-08-31 DIAGNOSIS — G8929 Other chronic pain: Secondary | ICD-10-CM | POA: Diagnosis not present

## 2021-08-31 DIAGNOSIS — I10 Essential (primary) hypertension: Secondary | ICD-10-CM | POA: Diagnosis not present

## 2021-08-31 DIAGNOSIS — K5903 Drug induced constipation: Secondary | ICD-10-CM | POA: Diagnosis not present

## 2021-08-31 DIAGNOSIS — M25562 Pain in left knee: Secondary | ICD-10-CM | POA: Diagnosis not present

## 2021-08-31 DIAGNOSIS — Z23 Encounter for immunization: Secondary | ICD-10-CM | POA: Diagnosis not present

## 2021-09-04 ENCOUNTER — Telehealth: Payer: Self-pay

## 2021-09-04 NOTE — Telephone Encounter (Signed)
   [facility] Pre-operative Risk Assessment    Request for surgical clearance:  What type of surgery is being performed? Left THR   When is this surgery scheduled? TBD   What type of clearance is required (medical clearance vs. Pharmacy clearance to hold med vs. Both)? Medical   Are there any medications that need to be held prior to surgery and how long?Not specified    Practice name and name of physician performing surgery? Dr. Donivan Scull at [facility]    What is your office phone number: [phone]    7.   What is your office fax number: [phone]  8.   Anesthesia type (None, local, MAC, general)? General Anesthesia    Basil Dess Jovee Dettinger 09/04/2021, 9:24 AM  _________________________________________________________________   (provider comments below)

## 2021-09-04 NOTE — Telephone Encounter (Signed)
   Name: Carl Edwards  DOB: Jan 22, 1971  MRN: [id number]   Primary Cardiologist: Shirlee More, MD  Chart reviewed as part of pre-operative protocol coverage. Patient was contacted 09/04/2021 in reference to pre-operative risk assessment for pending surgery as outlined below.  Carl Edwards was last seen on 12/22/20 by Dr. Bettina Gavia.  Since that day, Carl Edwards has done fine from a cardiac standpoint. His hip pain has progressed significantly in the past 2 weeks prompting him to seek options for repair. Prior to 2 weeks ago he was able to complete 4 METs without anginal complaints  Therefore, based on ACC/AHA guidelines, the patient would be at acceptable risk for the planned procedure without further cardiovascular testing.   The patient was advised that if he develops new symptoms prior to surgery to contact our office to arrange for a follow-up visit, and he verbalized understanding.  Per previous recommendations by Dr. Bettina Gavia "With his previous fall trauma and orthostatic hypotension after surgery I think he should remain in the hospital overnight.  I will leave decision regarding anesthesia to the anesthesiology team but they may choose to avoid spinal."  I will route this recommendation to the requesting party via Epic fax function and remove from pre-op pool. Please call with questions.  Abigail Butts, PA-C 09/04/2021, 11:13 AM

## 2021-09-07 DIAGNOSIS — M79603 Pain in arm, unspecified: Secondary | ICD-10-CM | POA: Diagnosis not present

## 2021-09-07 DIAGNOSIS — R52 Pain, unspecified: Secondary | ICD-10-CM | POA: Diagnosis not present

## 2021-09-07 DIAGNOSIS — Z79899 Other long term (current) drug therapy: Secondary | ICD-10-CM | POA: Diagnosis not present

## 2021-09-07 DIAGNOSIS — Z01818 Encounter for other preprocedural examination: Secondary | ICD-10-CM | POA: Diagnosis not present

## 2021-09-10 DIAGNOSIS — M1612 Unilateral primary osteoarthritis, left hip: Secondary | ICD-10-CM | POA: Diagnosis not present

## 2021-09-23 DIAGNOSIS — Z471 Aftercare following joint replacement surgery: Secondary | ICD-10-CM | POA: Diagnosis not present

## 2021-09-23 DIAGNOSIS — G4733 Obstructive sleep apnea (adult) (pediatric): Secondary | ICD-10-CM | POA: Diagnosis not present

## 2021-09-23 DIAGNOSIS — Z96642 Presence of left artificial hip joint: Secondary | ICD-10-CM | POA: Diagnosis not present

## 2021-09-23 DIAGNOSIS — E669 Obesity, unspecified: Secondary | ICD-10-CM | POA: Diagnosis not present

## 2021-09-23 DIAGNOSIS — M1612 Unilateral primary osteoarthritis, left hip: Secondary | ICD-10-CM | POA: Diagnosis not present

## 2021-10-22 DIAGNOSIS — M1612 Unilateral primary osteoarthritis, left hip: Secondary | ICD-10-CM | POA: Diagnosis not present

## 2021-12-25 DIAGNOSIS — M1610 Unilateral primary osteoarthritis, unspecified hip: Secondary | ICD-10-CM | POA: Diagnosis not present

## 2021-12-25 DIAGNOSIS — M545 Low back pain, unspecified: Secondary | ICD-10-CM | POA: Diagnosis not present

## 2022-01-05 DIAGNOSIS — M5126 Other intervertebral disc displacement, lumbar region: Secondary | ICD-10-CM | POA: Diagnosis not present

## 2022-01-05 DIAGNOSIS — M5416 Radiculopathy, lumbar region: Secondary | ICD-10-CM | POA: Diagnosis not present

## 2022-01-07 DIAGNOSIS — M545 Low back pain, unspecified: Secondary | ICD-10-CM | POA: Diagnosis not present

## 2022-01-29 DIAGNOSIS — Z96642 Presence of left artificial hip joint: Secondary | ICD-10-CM | POA: Diagnosis not present

## 2022-01-29 DIAGNOSIS — M1610 Unilateral primary osteoarthritis, unspecified hip: Secondary | ICD-10-CM | POA: Diagnosis not present

## 2022-02-15 DIAGNOSIS — I10 Essential (primary) hypertension: Secondary | ICD-10-CM | POA: Diagnosis not present

## 2022-02-15 DIAGNOSIS — Z79899 Other long term (current) drug therapy: Secondary | ICD-10-CM | POA: Diagnosis not present

## 2022-02-15 DIAGNOSIS — G8929 Other chronic pain: Secondary | ICD-10-CM | POA: Diagnosis not present

## 2022-02-15 DIAGNOSIS — Z5181 Encounter for therapeutic drug level monitoring: Secondary | ICD-10-CM | POA: Diagnosis not present

## 2022-02-15 DIAGNOSIS — M5442 Lumbago with sciatica, left side: Secondary | ICD-10-CM | POA: Diagnosis not present

## 2022-02-15 DIAGNOSIS — M25552 Pain in left hip: Secondary | ICD-10-CM | POA: Diagnosis not present

## 2022-02-15 DIAGNOSIS — M25562 Pain in left knee: Secondary | ICD-10-CM | POA: Diagnosis not present

## 2022-03-08 DIAGNOSIS — M5416 Radiculopathy, lumbar region: Secondary | ICD-10-CM | POA: Diagnosis not present

## 2022-03-08 DIAGNOSIS — M25551 Pain in right hip: Secondary | ICD-10-CM | POA: Diagnosis not present

## 2022-03-08 DIAGNOSIS — M5136 Other intervertebral disc degeneration, lumbar region: Secondary | ICD-10-CM | POA: Diagnosis not present

## 2022-03-08 DIAGNOSIS — G8929 Other chronic pain: Secondary | ICD-10-CM | POA: Diagnosis not present

## 2022-03-08 DIAGNOSIS — M47816 Spondylosis without myelopathy or radiculopathy, lumbar region: Secondary | ICD-10-CM | POA: Diagnosis not present

## 2022-03-08 DIAGNOSIS — M7918 Myalgia, other site: Secondary | ICD-10-CM | POA: Diagnosis not present

## 2022-03-15 DIAGNOSIS — M25562 Pain in left knee: Secondary | ICD-10-CM | POA: Diagnosis not present

## 2022-03-15 DIAGNOSIS — M25551 Pain in right hip: Secondary | ICD-10-CM | POA: Diagnosis not present

## 2022-03-15 DIAGNOSIS — M25561 Pain in right knee: Secondary | ICD-10-CM | POA: Diagnosis not present

## 2022-03-15 DIAGNOSIS — M5442 Lumbago with sciatica, left side: Secondary | ICD-10-CM | POA: Diagnosis not present

## 2022-03-15 DIAGNOSIS — G8929 Other chronic pain: Secondary | ICD-10-CM | POA: Diagnosis not present

## 2022-04-06 DIAGNOSIS — M549 Dorsalgia, unspecified: Secondary | ICD-10-CM | POA: Diagnosis not present

## 2022-04-06 DIAGNOSIS — M5136 Other intervertebral disc degeneration, lumbar region: Secondary | ICD-10-CM | POA: Diagnosis not present

## 2022-04-06 DIAGNOSIS — G894 Chronic pain syndrome: Secondary | ICD-10-CM | POA: Diagnosis not present

## 2022-04-09 DIAGNOSIS — I10 Essential (primary) hypertension: Secondary | ICD-10-CM | POA: Diagnosis not present

## 2022-04-09 DIAGNOSIS — M25551 Pain in right hip: Secondary | ICD-10-CM | POA: Diagnosis not present

## 2022-04-09 DIAGNOSIS — M5441 Lumbago with sciatica, right side: Secondary | ICD-10-CM | POA: Diagnosis not present

## 2022-04-09 DIAGNOSIS — G8929 Other chronic pain: Secondary | ICD-10-CM | POA: Diagnosis not present

## 2022-04-23 DIAGNOSIS — M25551 Pain in right hip: Secondary | ICD-10-CM | POA: Diagnosis not present

## 2022-04-30 DIAGNOSIS — M1610 Unilateral primary osteoarthritis, unspecified hip: Secondary | ICD-10-CM | POA: Diagnosis not present

## 2022-04-30 DIAGNOSIS — Z96642 Presence of left artificial hip joint: Secondary | ICD-10-CM | POA: Diagnosis not present

## 2022-05-04 DIAGNOSIS — M25552 Pain in left hip: Secondary | ICD-10-CM | POA: Diagnosis not present

## 2022-05-04 DIAGNOSIS — Z79899 Other long term (current) drug therapy: Secondary | ICD-10-CM | POA: Diagnosis not present

## 2022-05-04 DIAGNOSIS — M5441 Lumbago with sciatica, right side: Secondary | ICD-10-CM | POA: Diagnosis not present

## 2022-05-04 DIAGNOSIS — M5136 Other intervertebral disc degeneration, lumbar region: Secondary | ICD-10-CM | POA: Diagnosis not present

## 2022-05-04 DIAGNOSIS — Z5181 Encounter for therapeutic drug level monitoring: Secondary | ICD-10-CM | POA: Diagnosis not present

## 2022-05-04 DIAGNOSIS — M25551 Pain in right hip: Secondary | ICD-10-CM | POA: Diagnosis not present

## 2022-05-04 DIAGNOSIS — M549 Dorsalgia, unspecified: Secondary | ICD-10-CM | POA: Diagnosis not present

## 2022-05-04 DIAGNOSIS — G894 Chronic pain syndrome: Secondary | ICD-10-CM | POA: Diagnosis not present

## 2022-05-31 DIAGNOSIS — M25551 Pain in right hip: Secondary | ICD-10-CM | POA: Diagnosis not present

## 2022-05-31 DIAGNOSIS — M5441 Lumbago with sciatica, right side: Secondary | ICD-10-CM | POA: Diagnosis not present

## 2022-05-31 DIAGNOSIS — G8929 Other chronic pain: Secondary | ICD-10-CM | POA: Diagnosis not present

## 2022-05-31 DIAGNOSIS — Z79899 Other long term (current) drug therapy: Secondary | ICD-10-CM | POA: Diagnosis not present

## 2022-05-31 DIAGNOSIS — Z5181 Encounter for therapeutic drug level monitoring: Secondary | ICD-10-CM | POA: Diagnosis not present

## 2022-05-31 DIAGNOSIS — M5442 Lumbago with sciatica, left side: Secondary | ICD-10-CM | POA: Diagnosis not present

## 2022-06-28 DIAGNOSIS — N401 Enlarged prostate with lower urinary tract symptoms: Secondary | ICD-10-CM | POA: Diagnosis not present

## 2022-06-28 DIAGNOSIS — N138 Other obstructive and reflux uropathy: Secondary | ICD-10-CM | POA: Diagnosis not present

## 2022-07-05 DIAGNOSIS — M549 Dorsalgia, unspecified: Secondary | ICD-10-CM | POA: Diagnosis not present

## 2022-07-05 DIAGNOSIS — Z1389 Encounter for screening for other disorder: Secondary | ICD-10-CM | POA: Diagnosis not present

## 2022-07-05 DIAGNOSIS — G894 Chronic pain syndrome: Secondary | ICD-10-CM | POA: Diagnosis not present

## 2022-07-05 DIAGNOSIS — M5136 Other intervertebral disc degeneration, lumbar region: Secondary | ICD-10-CM | POA: Diagnosis not present

## 2022-08-16 DIAGNOSIS — Z79891 Long term (current) use of opiate analgesic: Secondary | ICD-10-CM | POA: Diagnosis not present

## 2022-08-16 DIAGNOSIS — M549 Dorsalgia, unspecified: Secondary | ICD-10-CM | POA: Diagnosis not present

## 2022-08-16 DIAGNOSIS — M5136 Other intervertebral disc degeneration, lumbar region: Secondary | ICD-10-CM | POA: Diagnosis not present

## 2022-08-16 DIAGNOSIS — G894 Chronic pain syndrome: Secondary | ICD-10-CM | POA: Diagnosis not present

## 2022-08-16 DIAGNOSIS — Z1389 Encounter for screening for other disorder: Secondary | ICD-10-CM | POA: Diagnosis not present

## 2022-08-30 DIAGNOSIS — K219 Gastro-esophageal reflux disease without esophagitis: Secondary | ICD-10-CM | POA: Diagnosis not present

## 2022-08-30 DIAGNOSIS — F3342 Major depressive disorder, recurrent, in full remission: Secondary | ICD-10-CM | POA: Diagnosis not present

## 2022-08-30 DIAGNOSIS — Z131 Encounter for screening for diabetes mellitus: Secondary | ICD-10-CM | POA: Diagnosis not present

## 2022-08-30 DIAGNOSIS — Z23 Encounter for immunization: Secondary | ICD-10-CM | POA: Diagnosis not present

## 2022-08-30 DIAGNOSIS — I10 Essential (primary) hypertension: Secondary | ICD-10-CM | POA: Diagnosis not present

## 2022-08-30 DIAGNOSIS — E782 Mixed hyperlipidemia: Secondary | ICD-10-CM | POA: Diagnosis not present

## 2022-09-10 DIAGNOSIS — D649 Anemia, unspecified: Secondary | ICD-10-CM | POA: Diagnosis not present

## 2022-09-10 DIAGNOSIS — D510 Vitamin B12 deficiency anemia due to intrinsic factor deficiency: Secondary | ICD-10-CM | POA: Diagnosis not present

## 2022-09-17 DIAGNOSIS — M549 Dorsalgia, unspecified: Secondary | ICD-10-CM | POA: Diagnosis not present

## 2022-09-17 DIAGNOSIS — G894 Chronic pain syndrome: Secondary | ICD-10-CM | POA: Diagnosis not present

## 2022-09-17 DIAGNOSIS — M5136 Other intervertebral disc degeneration, lumbar region: Secondary | ICD-10-CM | POA: Diagnosis not present

## 2022-09-17 DIAGNOSIS — Z1389 Encounter for screening for other disorder: Secondary | ICD-10-CM | POA: Diagnosis not present

## 2022-09-17 DIAGNOSIS — Z79891 Long term (current) use of opiate analgesic: Secondary | ICD-10-CM | POA: Diagnosis not present

## 2022-09-21 ENCOUNTER — Other Ambulatory Visit: Payer: Self-pay

## 2022-09-21 MED ORDER — BUPROPION HCL ER (XL) 150 MG PO TB24: 150.0000 mg | ORAL_TABLET | Freq: Every day | ORAL | 3 refills | Status: DC

## 2022-09-21 MED ORDER — BUPROPION HCL ER (XL) 300 MG PO TB24
300.0000 mg | ORAL_TABLET | Freq: Every day | ORAL | 3 refills | Status: DC
Start: 2022-09-21 — End: 2023-01-31

## 2022-10-15 DIAGNOSIS — Z1389 Encounter for screening for other disorder: Secondary | ICD-10-CM | POA: Diagnosis not present

## 2022-10-15 DIAGNOSIS — M5136 Other intervertebral disc degeneration, lumbar region: Secondary | ICD-10-CM | POA: Diagnosis not present

## 2022-10-15 DIAGNOSIS — M549 Dorsalgia, unspecified: Secondary | ICD-10-CM | POA: Diagnosis not present

## 2022-10-15 DIAGNOSIS — Z79891 Long term (current) use of opiate analgesic: Secondary | ICD-10-CM | POA: Diagnosis not present

## 2022-10-22 DIAGNOSIS — K644 Residual hemorrhoidal skin tags: Secondary | ICD-10-CM | POA: Diagnosis not present

## 2022-10-22 DIAGNOSIS — K2970 Gastritis, unspecified, without bleeding: Secondary | ICD-10-CM | POA: Diagnosis not present

## 2022-10-22 DIAGNOSIS — K5730 Diverticulosis of large intestine without perforation or abscess without bleeding: Secondary | ICD-10-CM | POA: Diagnosis not present

## 2022-10-22 DIAGNOSIS — Z23 Encounter for immunization: Secondary | ICD-10-CM | POA: Diagnosis not present

## 2022-10-22 DIAGNOSIS — K648 Other hemorrhoids: Secondary | ICD-10-CM | POA: Diagnosis not present

## 2022-10-22 DIAGNOSIS — D649 Anemia, unspecified: Secondary | ICD-10-CM | POA: Diagnosis not present

## 2022-10-22 DIAGNOSIS — K219 Gastro-esophageal reflux disease without esophagitis: Secondary | ICD-10-CM | POA: Diagnosis not present

## 2022-10-22 DIAGNOSIS — Z980 Intestinal bypass and anastomosis status: Secondary | ICD-10-CM | POA: Diagnosis not present

## 2022-10-22 DIAGNOSIS — Z9049 Acquired absence of other specified parts of digestive tract: Secondary | ICD-10-CM | POA: Diagnosis not present

## 2022-10-22 DIAGNOSIS — F17220 Nicotine dependence, chewing tobacco, uncomplicated: Secondary | ICD-10-CM | POA: Diagnosis not present

## 2022-10-22 DIAGNOSIS — K3189 Other diseases of stomach and duodenum: Secondary | ICD-10-CM | POA: Diagnosis not present

## 2022-10-22 DIAGNOSIS — K449 Diaphragmatic hernia without obstruction or gangrene: Secondary | ICD-10-CM | POA: Diagnosis not present

## 2022-10-22 DIAGNOSIS — Z9884 Bariatric surgery status: Secondary | ICD-10-CM | POA: Diagnosis not present

## 2022-10-29 ENCOUNTER — Other Ambulatory Visit: Payer: Self-pay

## 2022-10-29 MED ORDER — FLUTICASONE PROPIONATE 50 MCG/ACT NA SUSP
1.0000 | Freq: Every day | NASAL | 0 refills | Status: DC
Start: 2022-10-29 — End: 2022-12-10

## 2022-11-11 DIAGNOSIS — M549 Dorsalgia, unspecified: Secondary | ICD-10-CM | POA: Diagnosis not present

## 2022-11-11 DIAGNOSIS — Z79891 Long term (current) use of opiate analgesic: Secondary | ICD-10-CM | POA: Diagnosis not present

## 2022-11-11 DIAGNOSIS — M5136 Other intervertebral disc degeneration, lumbar region: Secondary | ICD-10-CM | POA: Diagnosis not present

## 2022-11-11 DIAGNOSIS — Z1389 Encounter for screening for other disorder: Secondary | ICD-10-CM | POA: Diagnosis not present

## 2022-11-17 ENCOUNTER — Encounter: Payer: Self-pay | Admitting: Internal Medicine

## 2022-11-29 ENCOUNTER — Ambulatory Visit: Payer: Managed Care, Other (non HMO) | Admitting: Internal Medicine

## 2022-11-29 ENCOUNTER — Encounter: Payer: Self-pay | Admitting: Internal Medicine

## 2022-11-29 VITALS — BP 132/80 | HR 63 | Temp 98.6°F | Resp 18 | Ht 78.0 in | Wt 310.0 lb

## 2022-11-29 DIAGNOSIS — E785 Hyperlipidemia, unspecified: Secondary | ICD-10-CM

## 2022-11-29 DIAGNOSIS — R110 Nausea: Secondary | ICD-10-CM | POA: Diagnosis not present

## 2022-11-29 DIAGNOSIS — K0889 Other specified disorders of teeth and supporting structures: Secondary | ICD-10-CM | POA: Diagnosis not present

## 2022-11-29 DIAGNOSIS — I10 Essential (primary) hypertension: Secondary | ICD-10-CM

## 2022-11-29 MED ORDER — PROMETHAZINE HCL 25 MG RE SUPP: 25.0000 mg | Freq: Three times a day (TID) | RECTAL | 1 refills | Status: DC | PRN

## 2022-11-29 MED ORDER — AMPICILLIN 500 MG PO CAPS: 500.0000 mg | ORAL_CAPSULE | Freq: Four times a day (QID) | ORAL | 0 refills | Status: AC

## 2022-11-29 NOTE — Assessment & Plan Note (Signed)
I will give ampicillin for prabable infection

## 2022-11-29 NOTE — Assessment & Plan Note (Signed)
He was started on lipitor 20 mg daily 3 months ago, he is due for labs today.

## 2022-11-29 NOTE — Progress Notes (Addendum)
Office Visit  Subjective   Patient ID: Carl Edwards   DOB: [birth year]   Age: 52 y.o.   MRN: [id number]   Chief Complaint Chief Complaint  Patient presents with   Follow-up   Hypertension    Mirgraine     History of Present Illness 52 years old male who is here for follow-up.  He follows with pain clinic and his pain is better.  He is complaining of tooth pain and his appointment with dentist is 3 weeks away.  He is asking if he can get some antibiotic for that. He has hypertension and his blood pressure is controlled. He also has hyperlipidemia and takes atorvastatin 20 mg daily.  His lipid panel was done October 23 that was reviewed.  He denies any side effect. He also complaining of nausea and he was given Zofran by gastroenterologist but that does not help.  He has a colonoscopy and EGD done December last year but I do not have the report of that but per patient colonoscopy was okay.  Past Medical History Past Medical History:  Diagnosis Date   Acute hypoxemic respiratory failure ([place]) 06/12/2018   Anxiety 03/24/2017   Bloody pleural effusion 06/12/2018   Chest pain in adult 11/13/2018   Complex partial seizure disorder ([place]) 04/19/2014   Depression 03/24/2017   Diverticulosis 03/24/2017   Hypertension    Knee pain, bilateral 03/24/2017   Migraine 04/19/2014   Morbid obesity with BMI of 40.0-44.9, adult ([place]) 12/06/2017   Nephrolithiasis 03/24/2017   Obstructive sleep apnea 04/19/2014   Pituitary tumor 06/13/2014   Pleural effusion on right    Postoperative intestinal malabsorption 06/12/2018   Posttraumatic arthropathy 01/20/2015   Restless legs syndrome 08/21/2014   S/P laparoscopic sleeve gastrectomy 06/12/2018   Shortness of breath on exertion 08/03/2018     Allergies Allergies  Allergen Reactions   Acetaminophen Nausea Only     Review of Systems Review of Systems  Constitutional: Negative.   HENT: Negative.    Respiratory: Negative.    Cardiovascular: Negative.    Gastrointestinal:  Positive for nausea.       Objective:    Vitals BP 132/80 (BP Location: Left Arm, Patient Position: Sitting, Cuff Size: Normal)   Pulse 63   Temp 98.6 F (37 C)   Resp 18   Ht '6\' 6"'$  (1.981 m)   Wt (!) 310 lb (140.6 kg)   SpO2 97%   BMI 35.82 kg/m    Physical Examination Physical Exam Constitutional:      Appearance: Normal appearance. He is normal weight.  HENT:     Head: Normocephalic and atraumatic.  Eyes:     Extraocular Movements: Extraocular movements intact.     Pupils: Pupils are equal, round, and reactive to light.  Cardiovascular:     Rate and Rhythm: Normal rate and regular rhythm.     Heart sounds: Normal heart sounds.  Pulmonary:     Effort: Pulmonary effort is normal.     Breath sounds: Normal breath sounds.  Abdominal:     General: Bowel sounds are normal.     Palpations: Abdomen is soft.  Neurological:     General: No focal deficit present.     Mental Status: He is alert and oriented to person, place, and time.        Assessment & Plan:   Hypertension controlled  Pain, dental I will give ampicillin for prabable infection  Dyslipidemia He was started on lipitor 20 mg daily 3 months  ago, he is due for labs today.     Return in about 3 months (around 02/28/2023).   Garwin Brothers, MD

## 2022-11-29 NOTE — Assessment & Plan Note (Signed)
controlled 

## 2022-11-30 ENCOUNTER — Other Ambulatory Visit: Payer: Self-pay

## 2022-11-30 LAB — LIPID PANEL
Chol/HDL Ratio: 4.6 ratio (ref 0.0–5.0)
Cholesterol, Total: 132 mg/dL (ref 100–199)
HDL: 29 mg/dL — ABNORMAL LOW (ref >39)
LDL Chol Calc (NIH): 68 mg/dL (ref 0–99)
Triglycerides: 207 mg/dL — ABNORMAL HIGH (ref 0–149)
VLDL Cholesterol Cal: 35 mg/dL (ref 5–40)

## 2022-11-30 LAB — CMP14 + ANION GAP
ALT: 9 IU/L (ref 0–44)
AST: 13 IU/L (ref 0–40)
Albumin/Globulin Ratio: 1.4 (ref 1.2–2.2)
Albumin: 4.2 g/dL (ref 3.8–4.9)
Alkaline Phosphatase: 52 IU/L (ref 44–121)
Anion Gap: 15 mmol/L (ref 10.0–18.0)
BUN/Creatinine Ratio: 6 — ABNORMAL LOW (ref 9–20)
BUN: 6 mg/dL (ref 6–24)
Bilirubin Total: 0.3 mg/dL (ref 0.0–1.2)
CO2: 25 mmol/L (ref 20–29)
Calcium: 9.3 mg/dL (ref 8.7–10.2)
Chloride: 105 mmol/L (ref 96–106)
Creatinine, Ser: 0.93 mg/dL (ref 0.76–1.27)
Globulin, Total: 2.9 g/dL (ref 1.5–4.5)
Glucose: 104 mg/dL — ABNORMAL HIGH (ref 70–99)
Potassium: 4.1 mmol/L (ref 3.5–5.2)
Sodium: 145 mmol/L — ABNORMAL HIGH (ref 134–144)
Total Protein: 7.1 g/dL (ref 6.0–8.5)
eGFR: 98 mL/min/{1.73_m2} (ref >59)

## 2022-11-30 MED ORDER — PROMETHAZINE HCL 25 MG PO TABS: 25.0000 mg | ORAL_TABLET | Freq: Four times a day (QID) | ORAL | 1 refills | Status: DC | PRN

## 2022-11-30 NOTE — Progress Notes (Signed)
Patient called.

## 2022-11-30 NOTE — Telephone Encounter (Signed)
This encounter was created in error - please disregard.

## 2022-12-05 ENCOUNTER — Other Ambulatory Visit: Payer: Self-pay | Admitting: Internal Medicine

## 2022-12-09 DIAGNOSIS — R112 Nausea with vomiting, unspecified: Secondary | ICD-10-CM | POA: Diagnosis not present

## 2022-12-09 DIAGNOSIS — K219 Gastro-esophageal reflux disease without esophagitis: Secondary | ICD-10-CM | POA: Diagnosis not present

## 2022-12-09 DIAGNOSIS — K625 Hemorrhage of anus and rectum: Secondary | ICD-10-CM | POA: Diagnosis not present

## 2022-12-09 DIAGNOSIS — R634 Abnormal weight loss: Secondary | ICD-10-CM | POA: Diagnosis not present

## 2022-12-10 ENCOUNTER — Other Ambulatory Visit: Payer: Self-pay | Admitting: Internal Medicine

## 2022-12-13 DIAGNOSIS — M549 Dorsalgia, unspecified: Secondary | ICD-10-CM | POA: Diagnosis not present

## 2022-12-13 DIAGNOSIS — M5136 Other intervertebral disc degeneration, lumbar region: Secondary | ICD-10-CM | POA: Diagnosis not present

## 2022-12-13 DIAGNOSIS — G894 Chronic pain syndrome: Secondary | ICD-10-CM | POA: Diagnosis not present

## 2022-12-13 DIAGNOSIS — Z1389 Encounter for screening for other disorder: Secondary | ICD-10-CM | POA: Diagnosis not present

## 2022-12-13 DIAGNOSIS — Z79891 Long term (current) use of opiate analgesic: Secondary | ICD-10-CM | POA: Diagnosis not present

## 2022-12-28 DIAGNOSIS — J019 Acute sinusitis, unspecified: Secondary | ICD-10-CM | POA: Diagnosis not present

## 2022-12-28 DIAGNOSIS — R509 Fever, unspecified: Secondary | ICD-10-CM | POA: Diagnosis not present

## 2022-12-28 DIAGNOSIS — R051 Acute cough: Secondary | ICD-10-CM | POA: Diagnosis not present

## 2023-01-10 DIAGNOSIS — M5136 Other intervertebral disc degeneration, lumbar region: Secondary | ICD-10-CM | POA: Diagnosis not present

## 2023-01-10 DIAGNOSIS — Z1389 Encounter for screening for other disorder: Secondary | ICD-10-CM | POA: Diagnosis not present

## 2023-01-10 DIAGNOSIS — Z79891 Long term (current) use of opiate analgesic: Secondary | ICD-10-CM | POA: Diagnosis not present

## 2023-01-10 DIAGNOSIS — M549 Dorsalgia, unspecified: Secondary | ICD-10-CM | POA: Diagnosis not present

## 2023-01-12 ENCOUNTER — Other Ambulatory Visit: Payer: Self-pay | Admitting: Internal Medicine

## 2023-01-12 MED ORDER — FLUTICASONE PROPIONATE 50 MCG/ACT NA SUSP: 2.0000 | Freq: Every day | NASAL | 0 refills | Status: DC

## 2023-01-14 DIAGNOSIS — F419 Anxiety disorder, unspecified: Secondary | ICD-10-CM | POA: Diagnosis present

## 2023-01-14 DIAGNOSIS — J189 Pneumonia, unspecified organism: Secondary | ICD-10-CM | POA: Diagnosis not present

## 2023-01-14 DIAGNOSIS — J069 Acute upper respiratory infection, unspecified: Secondary | ICD-10-CM | POA: Diagnosis not present

## 2023-01-14 DIAGNOSIS — K219 Gastro-esophageal reflux disease without esophagitis: Secondary | ICD-10-CM | POA: Diagnosis not present

## 2023-01-14 DIAGNOSIS — Z792 Long term (current) use of antibiotics: Secondary | ICD-10-CM | POA: Diagnosis not present

## 2023-01-14 DIAGNOSIS — Z79899 Other long term (current) drug therapy: Secondary | ICD-10-CM | POA: Diagnosis not present

## 2023-01-14 DIAGNOSIS — R911 Solitary pulmonary nodule: Secondary | ICD-10-CM | POA: Diagnosis not present

## 2023-01-14 DIAGNOSIS — J9811 Atelectasis: Secondary | ICD-10-CM | POA: Diagnosis not present

## 2023-01-14 DIAGNOSIS — M1990 Unspecified osteoarthritis, unspecified site: Secondary | ICD-10-CM | POA: Diagnosis not present

## 2023-01-14 DIAGNOSIS — Z8719 Personal history of other diseases of the digestive system: Secondary | ICD-10-CM | POA: Diagnosis not present

## 2023-01-14 DIAGNOSIS — G4733 Obstructive sleep apnea (adult) (pediatric): Secondary | ICD-10-CM | POA: Diagnosis present

## 2023-01-14 DIAGNOSIS — Z9884 Bariatric surgery status: Secondary | ICD-10-CM | POA: Diagnosis not present

## 2023-01-14 DIAGNOSIS — Z79891 Long term (current) use of opiate analgesic: Secondary | ICD-10-CM | POA: Diagnosis not present

## 2023-01-14 DIAGNOSIS — J029 Acute pharyngitis, unspecified: Secondary | ICD-10-CM | POA: Diagnosis not present

## 2023-01-14 DIAGNOSIS — F32A Depression, unspecified: Secondary | ICD-10-CM | POA: Diagnosis present

## 2023-01-14 DIAGNOSIS — Z87442 Personal history of urinary calculi: Secondary | ICD-10-CM | POA: Diagnosis not present

## 2023-01-14 DIAGNOSIS — G8929 Other chronic pain: Secondary | ICD-10-CM | POA: Diagnosis present

## 2023-01-14 DIAGNOSIS — J3489 Other specified disorders of nose and nasal sinuses: Secondary | ICD-10-CM | POA: Diagnosis not present

## 2023-01-14 DIAGNOSIS — R0981 Nasal congestion: Secondary | ICD-10-CM | POA: Diagnosis not present

## 2023-01-14 DIAGNOSIS — N281 Cyst of kidney, acquired: Secondary | ICD-10-CM | POA: Diagnosis not present

## 2023-01-14 DIAGNOSIS — M25559 Pain in unspecified hip: Secondary | ICD-10-CM | POA: Diagnosis present

## 2023-01-14 DIAGNOSIS — I10 Essential (primary) hypertension: Secondary | ICD-10-CM | POA: Diagnosis not present

## 2023-01-14 DIAGNOSIS — K668 Other specified disorders of peritoneum: Secondary | ICD-10-CM | POA: Diagnosis not present

## 2023-01-14 DIAGNOSIS — K8020 Calculus of gallbladder without cholecystitis without obstruction: Secondary | ICD-10-CM | POA: Diagnosis not present

## 2023-01-14 DIAGNOSIS — R0600 Dyspnea, unspecified: Secondary | ICD-10-CM | POA: Diagnosis not present

## 2023-01-14 DIAGNOSIS — R0602 Shortness of breath: Secondary | ICD-10-CM | POA: Diagnosis not present

## 2023-01-14 DIAGNOSIS — J45998 Other asthma: Secondary | ICD-10-CM | POA: Diagnosis not present

## 2023-01-21 ENCOUNTER — Encounter: Payer: Self-pay | Admitting: Internal Medicine

## 2023-01-21 ENCOUNTER — Ambulatory Visit: Payer: Managed Care, Other (non HMO) | Admitting: Internal Medicine

## 2023-01-21 VITALS — BP 130/80 | HR 68 | Temp 98.1°F | Resp 18 | Ht 78.0 in | Wt 302.5 lb

## 2023-01-21 DIAGNOSIS — J189 Pneumonia, unspecified organism: Secondary | ICD-10-CM | POA: Insufficient documentation

## 2023-01-21 DIAGNOSIS — K8020 Calculus of gallbladder without cholecystitis without obstruction: Secondary | ICD-10-CM

## 2023-01-21 DIAGNOSIS — K668 Other specified disorders of peritoneum: Secondary | ICD-10-CM

## 2023-01-21 DIAGNOSIS — I10 Essential (primary) hypertension: Secondary | ICD-10-CM

## 2023-01-21 NOTE — Progress Notes (Unsigned)
   Office Visit  Subjective   Patient ID: Carl Edwards   DOB: 1970-12-10   Age: 52 y.o.   MRN: [id number]   Chief Complaint Chief Complaint  Patient presents with   [facility] follow up     History of Present Illness 52 years old male who is here for follow-up from [facility] discharge. He was admitted to [facility] on 01/14/23 and discharge 01/16/23. He was discharge on antibiotic to have follow up at Bariatric surgery.  He follows with pain clinic and his pain is better.  He is complaining of tooth pain and his appointment with dentist is 3 weeks away.  He is asking if he can get some antibiotic for that. He has hypertension and his blood pressure is controlled. He also has hyperlipidemia and takes atorvastatin 20 mg daily.  His lipid panel was done October 23 that was reviewed.  He denies any side effect. He also complaining of nausea and he was given Zofran by gastroenterologist but that does not help.  He has a colonoscopy and EGD done December last year but I do not have the report of that but per patient colonoscopy was okay.  Past Medical History Past Medical History:  Diagnosis Date   Acute hypoxemic respiratory failure ([place]) 06/12/2018   Anxiety 03/24/2017   Bloody pleural effusion 06/12/2018   Chest pain in adult 11/13/2018   Complex partial seizure disorder ([place]) 04/19/2014   Depression 03/24/2017   Diverticulosis 03/24/2017   Hypertension    Knee pain, bilateral 03/24/2017   Migraine 04/19/2014   Morbid obesity with BMI of 40.0-44.9, adult ([place]) 12/06/2017   Nephrolithiasis 03/24/2017   Obstructive sleep apnea 04/19/2014   Pituitary tumor 06/13/2014   Pleural effusion on right    Postoperative intestinal malabsorption 06/12/2018   Posttraumatic arthropathy 01/20/2015   Restless legs syndrome 08/21/2014   S/P laparoscopic sleeve gastrectomy 06/12/2018   Shortness of breath on exertion 08/03/2018     Allergies Allergies  Allergen Reactions   Acetaminophen Nausea Only      Review of Systems ROS     Objective:    Vitals BP 130/80 (BP Location: Left Arm, Patient Position: Sitting, Cuff Size: Normal)   Pulse 68   Temp 98.1 F (36.7 C)   Resp 18   Ht 6\' 6"  (1.981 m)   Wt (!) 302 lb 8 oz (137.2 kg)   SpO2 94%   BMI 34.96 kg/m    Physical Examination Physical Exam     Assessment & Plan:   No problem-specific Assessment & Plan notes found for this encounter.    No follow-ups on file.   Garwin Brothers, MD

## 2023-01-23 DIAGNOSIS — K668 Other specified disorders of peritoneum: Secondary | ICD-10-CM | POA: Insufficient documentation

## 2023-01-23 DIAGNOSIS — K8020 Calculus of gallbladder without cholecystitis without obstruction: Secondary | ICD-10-CM | POA: Insufficient documentation

## 2023-01-23 NOTE — Assessment & Plan Note (Signed)
On Levaquin and Flagyl that he will complete.

## 2023-01-23 NOTE — Assessment & Plan Note (Signed)
No sign of infection

## 2023-01-23 NOTE — Assessment & Plan Note (Signed)
stable °

## 2023-01-23 NOTE — Assessment & Plan Note (Signed)
He will follow-up with bariatric surgeon Dr. Heron Nay in [place] where he had gastric sleeve surgery done 4 years ago.  He is tolerating diet without abdominal pain, nausea or vomiting.

## 2023-01-29 ENCOUNTER — Other Ambulatory Visit: Payer: Self-pay | Admitting: Internal Medicine

## 2023-02-07 ENCOUNTER — Encounter: Payer: Self-pay | Admitting: Internal Medicine

## 2023-02-09 ENCOUNTER — Other Ambulatory Visit: Payer: Self-pay

## 2023-02-09 MED ORDER — PROMETHAZINE HCL 25 MG PO TABS: 25.0000 mg | ORAL_TABLET | Freq: Four times a day (QID) | ORAL | 1 refills | Status: DC | PRN

## 2023-02-15 DIAGNOSIS — J189 Pneumonia, unspecified organism: Secondary | ICD-10-CM

## 2023-02-15 DIAGNOSIS — K8020 Calculus of gallbladder without cholecystitis without obstruction: Secondary | ICD-10-CM | POA: Diagnosis not present

## 2023-02-15 DIAGNOSIS — K668 Other specified disorders of peritoneum: Secondary | ICD-10-CM | POA: Diagnosis not present

## 2023-02-15 DIAGNOSIS — I10 Essential (primary) hypertension: Secondary | ICD-10-CM

## 2023-02-25 ENCOUNTER — Encounter: Payer: Self-pay | Admitting: Internal Medicine

## 2023-02-25 ENCOUNTER — Ambulatory Visit: Payer: Managed Care, Other (non HMO) | Admitting: Internal Medicine

## 2023-02-25 VITALS — BP 130/80 | HR 71 | Temp 97.7°F | Resp 18 | Ht 78.0 in | Wt 305.2 lb

## 2023-02-25 DIAGNOSIS — E785 Hyperlipidemia, unspecified: Secondary | ICD-10-CM

## 2023-02-25 DIAGNOSIS — F3342 Major depressive disorder, recurrent, in full remission: Secondary | ICD-10-CM | POA: Diagnosis not present

## 2023-02-25 DIAGNOSIS — I10 Essential (primary) hypertension: Secondary | ICD-10-CM | POA: Diagnosis not present

## 2023-02-25 DIAGNOSIS — G4733 Obstructive sleep apnea (adult) (pediatric): Secondary | ICD-10-CM

## 2023-02-25 MED ORDER — LOSARTAN POTASSIUM 50 MG PO TABS: 50.0000 mg | ORAL_TABLET | Freq: Every day | ORAL | 6 refills | Status: DC

## 2023-02-25 NOTE — Assessment & Plan Note (Signed)
In remission.

## 2023-02-25 NOTE — Progress Notes (Addendum)
Office Visit  Subjective   Patient ID: Carl Edwards   DOB: 22-Sep-1971   Age: 52 y.o.   MRN: [id number]   Chief Complaint Chief Complaint  Patient presents with   Follow-up    3 month follow up primary hypertension      History of Present Illness 52 years old male who is here for follow-up.  He says his BP has been up and down. One days he felt light headed and fell down due to low BP. He felt light headed and next thing he knew he was lying on floor. He stop taking lisinopril/Hctz. Since then he is feeling better. His blood pressure is 130/80 today.  He also has hyperlipidemia and takes atorvastatin 20 mg daily. His LDL is target controlled. His lipid panel was done 11/29/22 was reviewed.  He denies any side effect. He has sleep apnea and stop using CPAP, he says he can not sleep, he wake up few times at night and don't feel fresh.  He also says his depression is well controleld with wellbutrin 450 mg daily.   He also complaining of nausea and he was given Zofran by gastroenterologist but that does not help.  He has a colonoscopy and EGD done December 2023 but I do not have the report of that but per patient colonoscopy was okay.  Past Medical History Past Medical History:  Diagnosis Date   Acute hypoxemic respiratory failure 06/12/2018   Anxiety 03/24/2017   Bloody pleural effusion 06/12/2018   Chest pain in adult 11/13/2018   Complex partial seizure disorder 04/19/2014   Depression 03/24/2017   Diverticulosis 03/24/2017   Hypertension    Knee pain, bilateral 03/24/2017   Migraine 04/19/2014   Morbid obesity with BMI of 40.0-44.9, adult 12/06/2017   Nephrolithiasis 03/24/2017   Obstructive sleep apnea 04/19/2014   Pituitary tumor 06/13/2014   Pleural effusion on right    Postoperative intestinal malabsorption 06/12/2018   Posttraumatic arthropathy 01/20/2015   Restless legs syndrome 08/21/2014   S/P laparoscopic sleeve gastrectomy 06/12/2018   Shortness of breath on exertion 08/03/2018      Allergies Allergies  Allergen Reactions   Acetaminophen Nausea Only     Review of Systems Review of Systems  Constitutional: Negative.   HENT: Negative.    Respiratory: Negative.    Cardiovascular: Negative.   Gastrointestinal: Negative.   Neurological: Negative.        Objective:    Vitals BP 130/80 (BP Location: Left Arm, Patient Position: Sitting, Cuff Size: Normal)   Pulse 71   Temp 97.7 F (36.5 C)   Resp 18   Ht  (1.981 m)   Wt (!) 305 lb 4 oz (138.5 kg)   SpO2 94%   BMI 35.28 kg/m    Physical Examination Physical Exam Constitutional:      Appearance: Normal appearance. He is obese.  HENT:     Head: Normocephalic and atraumatic.  Cardiovascular:     Rate and Rhythm: Normal rate and regular rhythm.     Heart sounds: Normal heart sounds.  Pulmonary:     Effort: Pulmonary effort is normal.     Breath sounds: Normal breath sounds.  Abdominal:     General: Bowel sounds are normal.     Palpations: Abdomen is soft.  Neurological:     General: No focal deficit present.     Mental Status: He is alert and oriented to person, place, and time.        Assessment &  Plan:   Hypertension I will stop lisinopril/Hctz and will start losartan 50 mg daily. He will well hydrate himself.  OSA (obstructive sleep apnea) He will discuss with Dr. Blenda Nicely   Dyslipidemia Stable on atorvastatin 20 mg daily.  Depression In remission    Return in about 3 months (around 05/27/2023).   Eloisa Northern, MD

## 2023-02-25 NOTE — Assessment & Plan Note (Signed)
Stable on atorvastatin 20 mg daily

## 2023-02-25 NOTE — Assessment & Plan Note (Signed)
I will stop lisinopril/Hctz and will start losartan 50 mg daily. He will well hydrate himself.

## 2023-02-25 NOTE — Assessment & Plan Note (Signed)
He will discuss with Dr. Blenda Nicely

## 2023-04-04 ENCOUNTER — Other Ambulatory Visit: Payer: Self-pay | Admitting: Internal Medicine

## 2023-04-25 ENCOUNTER — Encounter: Payer: Self-pay | Admitting: Internal Medicine

## 2023-04-25 ENCOUNTER — Telehealth: Payer: Managed Care, Other (non HMO) | Admitting: Internal Medicine

## 2023-04-25 DIAGNOSIS — H538 Other visual disturbances: Secondary | ICD-10-CM

## 2023-04-25 NOTE — Assessment & Plan Note (Signed)
I will refer him to see [facility] for left blurry vision with conjunctivitis.

## 2023-04-25 NOTE — Progress Notes (Signed)
   Acute Office Visit  Subjective:     Patient ID: Carl Edwards, male    DOB: 1971/03/18, 52 y.o.   MRN: [id number]  No chief complaint on file.   HPI Carl Edwards was seen via telemedicine today. Patient is in today for blurry vision left eye and whitish pus noted in her left eye for few days. He says that he feel like when you come out of smoking pool and you have water in your eyes, no pain, he has to read with magnifying lens. He also says that his left eye is red. No injury.  Review of Systems  Eyes:  Positive for blurred vision, discharge and redness.        Objective:    There were no vitals taken for this visit.   Physical Exam  This was a telemedicine visit so no physical examination was done.       Assessment & Plan:   Problem List Items Addressed This Visit       Other   Blurry vision, left eye - Primary    I will refer him to see [facility] for left blurry vision with conjunctivitis.       No orders of the defined types were placed in this encounter.   No follow-ups on file.  Eloisa Northern, MD

## 2023-04-26 ENCOUNTER — Encounter: Payer: Self-pay | Admitting: Internal Medicine

## 2023-04-26 DIAGNOSIS — K8020 Calculus of gallbladder without cholecystitis without obstruction: Secondary | ICD-10-CM

## 2023-04-27 ENCOUNTER — Other Ambulatory Visit: Payer: Self-pay | Admitting: Internal Medicine

## 2023-04-29 ENCOUNTER — Other Ambulatory Visit: Payer: Self-pay | Admitting: Internal Medicine

## 2023-04-29 MED ORDER — OFLOXACIN 0.3 % OP SOLN: 1.0000 [drp] | Freq: Four times a day (QID) | OPHTHALMIC | 0 refills | Status: DC

## 2023-05-02 ENCOUNTER — Other Ambulatory Visit: Payer: Self-pay | Admitting: Internal Medicine

## 2023-05-03 ENCOUNTER — Other Ambulatory Visit: Payer: Self-pay | Admitting: Internal Medicine

## 2023-05-03 ENCOUNTER — Other Ambulatory Visit: Payer: Self-pay

## 2023-05-03 MED ORDER — FLUTICASONE PROPIONATE 50 MCG/ACT NA SUSP: NASAL | 1 refills | Status: DC

## 2023-05-27 ENCOUNTER — Ambulatory Visit: Payer: Managed Care, Other (non HMO) | Admitting: Internal Medicine

## 2023-05-27 ENCOUNTER — Encounter: Payer: Self-pay | Admitting: Internal Medicine

## 2023-05-27 VITALS — BP 128/80 | HR 71 | Temp 98.1°F | Resp 18 | Ht 78.0 in | Wt 308.4 lb

## 2023-05-27 DIAGNOSIS — G4733 Obstructive sleep apnea (adult) (pediatric): Secondary | ICD-10-CM | POA: Diagnosis not present

## 2023-05-27 DIAGNOSIS — E785 Hyperlipidemia, unspecified: Secondary | ICD-10-CM

## 2023-05-27 DIAGNOSIS — F3342 Major depressive disorder, recurrent, in full remission: Secondary | ICD-10-CM

## 2023-05-27 DIAGNOSIS — I10 Essential (primary) hypertension: Secondary | ICD-10-CM | POA: Diagnosis not present

## 2023-05-27 DIAGNOSIS — K8020 Calculus of gallbladder without cholecystitis without obstruction: Secondary | ICD-10-CM

## 2023-05-27 MED ORDER — OFLOXACIN 0.3 % OP SOLN: 1.0000 [drp] | Freq: Four times a day (QID) | OPHTHALMIC | 0 refills | Status: DC

## 2023-05-27 NOTE — Assessment & Plan Note (Signed)
He has not seen Dr. Blenda Nicely for few years.

## 2023-05-27 NOTE — Assessment & Plan Note (Signed)
He has history of gall stone with persistant nausea, I will do liver ultrasound.

## 2023-05-27 NOTE — Assessment & Plan Note (Signed)
On lipitor 20 mg daily.  ?

## 2023-05-27 NOTE — Assessment & Plan Note (Signed)
On current medication

## 2023-05-27 NOTE — Progress Notes (Signed)
Office Visit  Subjective   Patient ID: Carl Edwards   DOB: [birth year]   Age: 52 y.o.   MRN: [id number]   Chief Complaint Chief Complaint  Patient presents with   Hypertension     History of Present Illness 52 years old male who is here for follow-up.  He says his eyes got better with antibiotic but it became red again. He says blurry viision only when it pus in it otherwise he is not have blurry vision. He says his eye appointment with [facility] until Jan 25.   He has hypertension and his blood pressure is well controlled. He stop taking losartan 50 mg daily.   He also has hyperlipidemia and takes atorvastatin 20 mg daily. His LDL is target controlled. His lipid panel was done 11/29/22 was reviewed.  He denies any side effect.  He has sleep apnea and stop using CPAP, he says he can not sleep, he wake up few times at night and don't feel fresh.   He also says his depression is well controleld with wellbutrin 450 mg daily.    He says his nausea is coming back, he is worried about his gall bladder. He has a colonoscopy and EGD done December 2023 and it was ok.  He also says he could not sleep at night.   Past Medical History Past Medical History:  Diagnosis Date   Acute hypoxemic respiratory failure (HCC) 06/12/2018   Anxiety 03/24/2017   Bloody pleural effusion 06/12/2018   Chest pain in adult 11/13/2018   Complex partial seizure disorder (HCC) 04/19/2014   Depression 03/24/2017   Diverticulosis 03/24/2017   Hypertension    Knee pain, bilateral 03/24/2017   Migraine 04/19/2014   Morbid obesity with BMI of 40.0-44.9, adult (HCC) 12/06/2017   Nephrolithiasis 03/24/2017   Obstructive sleep apnea 04/19/2014   Pituitary tumor 06/13/2014   Pleural effusion on right    Postoperative intestinal malabsorption 06/12/2018   Posttraumatic arthropathy 01/20/2015   Restless legs syndrome 08/21/2014   S/P laparoscopic sleeve gastrectomy 06/12/2018   Shortness of breath on exertion 08/03/2018      Allergies Allergies  Allergen Reactions   Acetaminophen Nausea Only     Review of Systems Review of Systems  Constitutional: Negative.   HENT: Negative.    Eyes:  Positive for redness.  Respiratory: Negative.    Cardiovascular: Negative.   Gastrointestinal: Negative.   Neurological: Negative.        Objective:    Vitals BP 128/80 (BP Location: Left Arm, Patient Position: Sitting, Cuff Size: Normal)   Pulse 71   Temp 98.1 F (36.7 C)   Resp 18   Ht 6\' 6"  (1.981 m)   Wt (!) 308 lb 6 oz (139.9 kg)   SpO2 97%   BMI 35.64 kg/m    Physical Examination Physical Exam Constitutional:      Appearance: Normal appearance. He is obese.  HENT:     Head: Normocephalic and atraumatic.  Eyes:     General:        Right eye: Discharge present.  Cardiovascular:     Rate and Rhythm: Normal rate and regular rhythm.     Heart sounds: Normal heart sounds.  Pulmonary:     Effort: Pulmonary effort is normal.     Breath sounds: Normal breath sounds.  Abdominal:     General: Bowel sounds are normal.     Palpations: Abdomen is soft.  Neurological:     General: No focal deficit present.  Mental Status: He is alert and oriented to person, place, and time.        Assessment & Plan:   Hypertension Controlled  OSA (obstructive sleep apnea) He has not seen Dr. Blenda Nicely for few years.   Depression On current medication  Dyslipidemia On lipitor 20 mg daily  Cholelithiasis He has history of gall stone with persistant nausea, I will do liver ultrasound.    Return in about 3 months (around 08/27/2023).   Eloisa Northern, MD

## 2023-05-27 NOTE — Assessment & Plan Note (Signed)
Controlled.  

## 2023-05-31 ENCOUNTER — Ambulatory Visit: Payer: Self-pay | Admitting: Internal Medicine

## 2023-07-07 ENCOUNTER — Other Ambulatory Visit: Payer: Self-pay | Admitting: Internal Medicine

## 2023-08-04 ENCOUNTER — Other Ambulatory Visit: Payer: Self-pay | Admitting: Internal Medicine

## 2023-08-05 ENCOUNTER — Other Ambulatory Visit: Payer: Self-pay

## 2023-08-05 ENCOUNTER — Encounter: Payer: Self-pay | Admitting: Internal Medicine

## 2023-08-05 MED ORDER — PROMETHAZINE HCL 25 MG PO TABS: 25.0000 mg | ORAL_TABLET | Freq: Four times a day (QID) | ORAL | 2 refills | Status: DC | PRN

## 2023-08-29 ENCOUNTER — Encounter: Payer: Self-pay | Admitting: Internal Medicine

## 2023-08-29 ENCOUNTER — Ambulatory Visit: Payer: Managed Care, Other (non HMO) | Admitting: Internal Medicine

## 2023-08-29 VITALS — BP 134/80 | HR 78 | Temp 97.8°F | Ht 78.0 in | Wt 321.0 lb

## 2023-08-29 DIAGNOSIS — F419 Anxiety disorder, unspecified: Secondary | ICD-10-CM

## 2023-08-29 DIAGNOSIS — Z23 Encounter for immunization: Secondary | ICD-10-CM | POA: Diagnosis not present

## 2023-08-29 DIAGNOSIS — Z6841 Body Mass Index (BMI) 40.0 and over, adult: Secondary | ICD-10-CM

## 2023-08-29 DIAGNOSIS — I10 Essential (primary) hypertension: Secondary | ICD-10-CM

## 2023-08-29 DIAGNOSIS — E785 Hyperlipidemia, unspecified: Secondary | ICD-10-CM | POA: Diagnosis not present

## 2023-08-29 DIAGNOSIS — G4733 Obstructive sleep apnea (adult) (pediatric): Secondary | ICD-10-CM

## 2023-08-29 DIAGNOSIS — F3342 Major depressive disorder, recurrent, in full remission: Secondary | ICD-10-CM

## 2023-08-29 MED ORDER — PROMETHAZINE HCL 25 MG PO TABS: 25.0000 mg | ORAL_TABLET | Freq: Three times a day (TID) | ORAL | 2 refills | Status: DC | PRN

## 2023-08-29 MED ORDER — QUETIAPINE FUMARATE 25 MG PO TABS: 25.0000 mg | ORAL_TABLET | Freq: Every day | ORAL | 3 refills | Status: DC

## 2023-08-29 NOTE — Assessment & Plan Note (Signed)
His BMI is 37, with hypertension and dyslipidemia make it morbidly obese.

## 2023-08-29 NOTE — Assessment & Plan Note (Signed)
He takes wellbutrin 450 mg daily and I will add seroquil 25 mg daily. I have stopped his trazodone.

## 2023-08-29 NOTE — Assessment & Plan Note (Signed)
He takes atorvastatin 20 mg daily

## 2023-08-29 NOTE — Progress Notes (Signed)
Office Visit  Subjective   Patient ID: Carl Edwards   DOB: 06-Sep-1971   Age: 53 y.o.   MRN: [id number]   Chief Complaint Chief Complaint  Patient presents with   Follow-up    3 month follow up     History of Present Illness 52 years old male who is here for follow-up.  He says he is depress because of taking care of his mother. He does stress eating and has gained 13 pounds since last visit. He says that he does not sleep. He has been using a lot of promethazine. His weight is 321 pounds with BMI of 37.     He has hypertension and his blood pressure is well controlled. He is taking losartan 50 mg daily.   He also has hyperlipidemia and takes atorvastatin 20 mg daily. His LDL is target controlled. His lipid panel was done 11/29/22 was reviewed.  He denies any side effect.   He has sleep apnea and stop using CPAP, he says that he need to see them.     He also says his depression is well controleld with wellbutrin 450 mg daily.    He attribute his nausea to his gall bladder. He has a colonoscopy and EGD done December 2023 and it was ok.      Past Medical History Past Medical History:  Diagnosis Date   Acute hypoxemic respiratory failure (HCC) 06/12/2018   Anxiety 03/24/2017   Bloody pleural effusion 06/12/2018   Chest pain in adult 11/13/2018   Complex partial seizure disorder (HCC) 04/19/2014   Depression 03/24/2017   Diverticulosis 03/24/2017   Hypertension    Knee pain, bilateral 03/24/2017   Migraine 04/19/2014   Morbid obesity with BMI of 40.0-44.9, adult (HCC) 12/06/2017   Nephrolithiasis 03/24/2017   Obstructive sleep apnea 04/19/2014   Pituitary tumor 06/13/2014   Pleural effusion on right    Postoperative intestinal malabsorption 06/12/2018   Posttraumatic arthropathy 01/20/2015   Restless legs syndrome 08/21/2014   S/P laparoscopic sleeve gastrectomy 06/12/2018   Shortness of breath on exertion 08/03/2018     Allergies Allergies  Allergen Reactions   Acetaminophen Nausea Only      Review of Systems Review of Systems  Constitutional: Negative.   HENT: Negative.    Respiratory: Negative.    Cardiovascular: Negative.   Gastrointestinal: Negative.   Neurological: Negative.        Objective:    Vitals BP 134/80 (BP Location: Left Arm, Patient Position: Sitting, Cuff Size: Normal)   Pulse 78   Temp 97.8 F (36.6 C)   Ht 6\' 6"  (1.981 m)   Wt (!) 321 lb (145.6 kg)   SpO2 93%   BMI 37.10 kg/m    Physical Examination Physical Exam Constitutional:      Appearance: Normal appearance. He is obese.  HENT:     Head: Normocephalic and atraumatic.  Cardiovascular:     Rate and Rhythm: Normal rate and regular rhythm.     Heart sounds: Normal heart sounds.  Pulmonary:     Effort: Pulmonary effort is normal.     Breath sounds: Normal breath sounds.  Abdominal:     General: Bowel sounds are normal.     Palpations: Abdomen is soft.  Neurological:     General: No focal deficit present.     Mental Status: He is alert and oriented to person, place, and time.        Assessment & Plan:   Hypertension stable  Depression He  takes wellbutrin 450 mg daily and I will add seroquil 25 mg daily. I have stopped his trazodone.   Dyslipidemia He takes atorvastatin 20 mg daily  OSA (obstructive sleep apnea) He will talk to sleep doctor so he can use CPAP  Morbid obesity with BMI of 40.0-44.9, adult (HCC) His BMI is 37, with hypertension and dyslipidemia make it morbidly obese.     Return in about 2 months (around 10/29/2023).   Eloisa Northern, MD

## 2023-08-29 NOTE — Assessment & Plan Note (Signed)
He will talk to sleep doctor so he can use CPAP

## 2023-08-29 NOTE — Assessment & Plan Note (Signed)
stable °

## 2023-09-05 ENCOUNTER — Encounter: Payer: Self-pay | Admitting: Internal Medicine

## 2023-09-05 ENCOUNTER — Ambulatory Visit: Payer: Managed Care, Other (non HMO) | Admitting: Internal Medicine

## 2023-09-05 VITALS — BP 124/80 | HR 86 | Temp 98.1°F | Resp 18 | Ht 78.0 in | Wt 322.0 lb

## 2023-09-05 DIAGNOSIS — F3341 Major depressive disorder, recurrent, in partial remission: Secondary | ICD-10-CM

## 2023-09-05 NOTE — Progress Notes (Signed)
Office Visit  Subjective   Patient ID: Carl Edwards   DOB: [birth year]   Age: 52 y.o.   MRN: [id number]   Chief Complaint Chief Complaint  Patient presents with   Therapy    Psychiatry referral or therapy     History of Present Illness 52 years old male who is here to discuss his depression.  He will look after her mother and he is the only child.  He says that this is getting overwhelmed as he cannot continue taking care of her like that.  He take bupropion 450 mg daily and Seroquel 25 mg at bedtime.  He says that his depression is getting worse and he needs a referral to see psychiatrist.  He denies having any suicidal ideation.  Past Medical History Past Medical History:  Diagnosis Date   Acute hypoxemic respiratory failure (HCC) 06/12/2018   Anxiety 03/24/2017   Bloody pleural effusion 06/12/2018   Chest pain in adult 11/13/2018   Complex partial seizure disorder (HCC) 04/19/2014   Depression 03/24/2017   Diverticulosis 03/24/2017   Hypertension    Knee pain, bilateral 03/24/2017   Migraine 04/19/2014   Morbid obesity with BMI of 40.0-44.9, adult (HCC) 12/06/2017   Nephrolithiasis 03/24/2017   Obstructive sleep apnea 04/19/2014   Pituitary tumor 06/13/2014   Pleural effusion on right    Postoperative intestinal malabsorption 06/12/2018   Posttraumatic arthropathy 01/20/2015   Restless legs syndrome 08/21/2014   S/P laparoscopic sleeve gastrectomy 06/12/2018   Shortness of breath on exertion 08/03/2018     Allergies Allergies  Allergen Reactions   Acetaminophen Nausea Only     Review of Systems Review of Systems  Constitutional: Negative.   Neurological: Negative.   Psychiatric/Behavioral:  Positive for depression. The patient has insomnia.        Objective:    Vitals BP 124/80 (BP Location: Left Arm, Patient Position: Sitting, Cuff Size: Normal)   Pulse 86   Temp 98.1 F (36.7 C)   Resp 18   Ht 6\' 6"  (1.981 m)   Wt (!) 322 lb (146.1 kg)   SpO2 96%   BMI 37.21 kg/m     Physical Examination Physical Exam Constitutional:      Appearance: He is obese.  Cardiovascular:     Rate and Rhythm: Normal rate and regular rhythm.     Heart sounds: Normal heart sounds.  Pulmonary:     Effort: Pulmonary effort is normal.     Breath sounds: Normal breath sounds.  Neurological:     General: No focal deficit present.     Mental Status: He is alert and oriented to person, place, and time.        Assessment & Plan:   Depression I will refer him to see psychiatrist.  I have also given him number for psychotherapy as well.  He will call if he has any suicidal ideation.    No follow-ups on file.   Eloisa Northern, MD

## 2023-09-05 NOTE — Assessment & Plan Note (Signed)
I will refer him to see psychiatrist.

## 2023-09-27 ENCOUNTER — Other Ambulatory Visit: Payer: Self-pay | Admitting: Internal Medicine

## 2023-10-05 ENCOUNTER — Other Ambulatory Visit: Payer: Self-pay | Admitting: Internal Medicine

## 2023-10-19 ENCOUNTER — Encounter: Payer: Managed Care, Other (non HMO) | Admitting: Internal Medicine

## 2023-10-20 ENCOUNTER — Other Ambulatory Visit: Payer: Self-pay | Admitting: Internal Medicine

## 2023-11-14 ENCOUNTER — Encounter: Payer: Managed Care, Other (non HMO) | Admitting: Internal Medicine

## 2023-11-15 ENCOUNTER — Other Ambulatory Visit: Payer: Self-pay | Admitting: Internal Medicine

## 2023-11-21 ENCOUNTER — Encounter: Payer: Self-pay | Admitting: Internal Medicine

## 2023-11-21 ENCOUNTER — Ambulatory Visit: Payer: Managed Care, Other (non HMO) | Admitting: Internal Medicine

## 2023-11-21 VITALS — BP 130/90 | HR 74 | Temp 98.6°F | Resp 18 | Ht 78.0 in | Wt 315.1 lb

## 2023-11-21 DIAGNOSIS — E559 Vitamin D deficiency, unspecified: Secondary | ICD-10-CM | POA: Insufficient documentation

## 2023-11-21 DIAGNOSIS — F3341 Major depressive disorder, recurrent, in partial remission: Secondary | ICD-10-CM

## 2023-11-21 DIAGNOSIS — E785 Hyperlipidemia, unspecified: Secondary | ICD-10-CM | POA: Diagnosis not present

## 2023-11-21 DIAGNOSIS — I10 Essential (primary) hypertension: Secondary | ICD-10-CM

## 2023-11-21 DIAGNOSIS — R3912 Poor urinary stream: Secondary | ICD-10-CM

## 2023-11-21 DIAGNOSIS — Z0000 Encounter for general adult medical examination without abnormal findings: Secondary | ICD-10-CM | POA: Insufficient documentation

## 2023-11-21 DIAGNOSIS — E538 Deficiency of other specified B group vitamins: Secondary | ICD-10-CM | POA: Insufficient documentation

## 2023-11-21 DIAGNOSIS — Z6836 Body mass index (BMI) 36.0-36.9, adult: Secondary | ICD-10-CM | POA: Diagnosis not present

## 2023-11-21 DIAGNOSIS — N401 Enlarged prostate with lower urinary tract symptoms: Secondary | ICD-10-CM | POA: Diagnosis not present

## 2023-11-21 DIAGNOSIS — G4733 Obstructive sleep apnea (adult) (pediatric): Secondary | ICD-10-CM

## 2023-11-21 DIAGNOSIS — R6889 Other general symptoms and signs: Secondary | ICD-10-CM | POA: Insufficient documentation

## 2023-11-21 DIAGNOSIS — Z131 Encounter for screening for diabetes mellitus: Secondary | ICD-10-CM

## 2023-11-21 MED ORDER — SHINGRIX 50 MCG/0.5ML IM SUSR: 0.5000 mL | Freq: Once | INTRAMUSCULAR | 0 refills | Status: AC

## 2023-11-21 MED ORDER — TAMSULOSIN HCL 0.4 MG PO CAPS: 0.8000 mg | ORAL_CAPSULE | Freq: Every day | ORAL | 11 refills | Status: AC

## 2023-11-21 MED ORDER — CARIPRAZINE HCL 1.5 MG PO CAPS: 1.5000 mg | ORAL_CAPSULE | Freq: Every day | ORAL | 6 refills | Status: DC

## 2023-11-21 NOTE — Assessment & Plan Note (Signed)
 His BMI is 36, with hypertension, hyperlipidemia make it morbidly obese.

## 2023-11-21 NOTE — Assessment & Plan Note (Signed)
He is fasting for lipid panel today

## 2023-11-21 NOTE — Assessment & Plan Note (Signed)
 Controlled.

## 2023-11-21 NOTE — Assessment & Plan Note (Signed)
 He is not using CPAP and is not sleeping, I will refer him to see Dr. Blenda Nicely.

## 2023-11-21 NOTE — Assessment & Plan Note (Signed)
I will do TSH level today

## 2023-11-21 NOTE — Assessment & Plan Note (Signed)
 He take bupropion 450 mg daily and is still depress, I will add Vraylar 1.5 mg to see if that help.

## 2023-11-21 NOTE — Progress Notes (Signed)
 Office Visit  Subjective   Patient ID: Carl Edwards   DOB: [birth year]   Age: 53 y.o.   MRN: [id number]   Chief Complaint Chief Complaint  Patient presents with   Annual Exam    Annual/ Fasting     History of Present Illness 53 years old male who is here for annual physical examination. He is on disability, he is married. He does not smoke, he does not drink.   He get flu shot and he got this year already. He has first COVID shot. He think his tetanus shot was within last 10 year. No shingle vaccine.   He has colonoscopy by Dr. Larene and next one will be in 10 year. He has EGD and colonoscopy within last 2 years. He is c/o weak urine stream.   He grand father died of lung and kidney cancer. On mother side thyroid  disease run in family.    He has B12 deficiency and stop taking replacement. He also has low vitamin D  but does not get replacement but say that he stay cold all the time.   He has OSA but he does not use any CPAP any more. He   He fhas chronic left hip, knee and back pain. He follows with pain clinic.   He has hypertension and his blood pressure is controlled on losartan  50 mg daily.  SABRA He also has hyperlipidemia and takes atorvastatin 20 mg daily.  He is fasting for lipid panel. He denies any side effects.   He has insomnia and says that Seroquel  did not work.   He has history of depression and take wellbutrin  450 mg daily. He does not have any suicidal ideation.   He is morbidly obese male who has weight loss surgery and lost close to 100 pounds.    Past Medical History Past Medical History:  Diagnosis Date   Acute hypoxemic respiratory failure (HCC) 06/12/2018   Anxiety 03/24/2017   Bloody pleural effusion 06/12/2018   Chest pain in adult 11/13/2018   Complex partial seizure disorder (HCC) 04/19/2014   Depression 03/24/2017   Diverticulosis 03/24/2017   Hypertension    Knee pain, bilateral 03/24/2017   Migraine 04/19/2014   Morbid obesity with BMI of  40.0-44.9, adult (HCC) 12/06/2017   Nephrolithiasis 03/24/2017   Obstructive sleep apnea 04/19/2014   Pituitary tumor 06/13/2014   Pleural effusion on right    Postoperative intestinal malabsorption 06/12/2018   Posttraumatic arthropathy 01/20/2015   Restless legs syndrome 08/21/2014   S/P laparoscopic sleeve gastrectomy 06/12/2018   Shortness of breath on exertion 08/03/2018     Allergies Allergies  Allergen Reactions   Acetaminophen  Nausea Only     Review of Systems Review of Systems  Constitutional: Negative.   HENT: Negative.    Respiratory: Negative.    Cardiovascular: Negative.   Gastrointestinal: Negative.   Neurological: Negative.        Objective:    Vitals BP (!) 130/90 (BP Location: Left Arm, Patient Position: Sitting, Cuff Size: Normal)   Pulse 74   Temp 98.6 F (37 C)   Resp 18   Ht 6' 6 (1.981 m)   Wt (!) 315 lb 2 oz (142.9 kg)   SpO2 96%   BMI 36.42 kg/m    Physical Examination Physical Exam Constitutional:      Appearance: Normal appearance. He is obese.  HENT:     Head: Normocephalic and atraumatic.  Eyes:     Extraocular Movements: Extraocular movements intact.  Pupils: Pupils are equal, round, and reactive to light.  Cardiovascular:     Rate and Rhythm: Normal rate and regular rhythm.     Heart sounds: Normal heart sounds.  Pulmonary:     Effort: Pulmonary effort is normal.     Breath sounds: Normal breath sounds.  Abdominal:     General: Bowel sounds are normal.     Palpations: Abdomen is soft.  Neurological:     General: No focal deficit present.     Mental Status: He is alert and oriented to person, place, and time.        Assessment & Plan:   Depression He take bupropion  450 mg daily and is still depress, I will add Vraylar  1.5 mg to see if that help.   Dyslipidemia He is fasting for lipid panel today.  Hypertension Controlled.  OSA (obstructive sleep apnea) He is not using CPAP and is not sleeping, I will refer him to  see Dr. Mardee.  Morbid obesity (HCC) His BMI is 36, with hypertension, hyperlipidemia make it morbidly obese.   Cold feeling I will do TSH level today.     Return in about 3 months (around 02/19/2024).   Roetta Dare, MD

## 2023-11-22 LAB — CBC WITH DIFFERENTIAL/PLATELET
Basophils Absolute: 0 10*3/uL (ref 0.0–0.2)
Basos: 1 %
EOS (ABSOLUTE): 0.1 10*3/uL (ref 0.0–0.4)
Eos: 1 %
Hematocrit: 47.8 % (ref 37.5–51.0)
Hemoglobin: 15.6 g/dL (ref 13.0–17.7)
Immature Grans (Abs): 0 10*3/uL (ref 0.0–0.1)
Immature Granulocytes: 0 %
Lymphocytes Absolute: 1.3 10*3/uL (ref 0.7–3.1)
Lymphs: 22 %
MCH: 28.6 pg (ref 26.6–33.0)
MCHC: 32.6 g/dL (ref 31.5–35.7)
MCV: 88 fL (ref 79–97)
Monocytes Absolute: 0.5 10*3/uL (ref 0.1–0.9)
Monocytes: 9 %
Neutrophils Absolute: 3.9 10*3/uL (ref 1.4–7.0)
Neutrophils: 67 %
Platelets: 223 10*3/uL (ref 150–450)
RBC: 5.46 x10E6/uL (ref 4.14–5.80)
RDW: 13.9 % (ref 11.6–15.4)
WBC: 5.9 10*3/uL (ref 3.4–10.8)

## 2023-11-22 LAB — VITAMIN B12: Vitamin B-12: 353 pg/mL (ref 232–1245)

## 2023-11-22 LAB — CMP14 + ANION GAP
ALT: 11 [IU]/L (ref 0–44)
AST: 15 [IU]/L (ref 0–40)
Albumin: 4.3 g/dL (ref 3.8–4.9)
Alkaline Phosphatase: 58 [IU]/L (ref 44–121)
Anion Gap: 14 mmol/L (ref 10.0–18.0)
BUN/Creatinine Ratio: 7 — ABNORMAL LOW (ref 9–20)
BUN: 6 mg/dL (ref 6–24)
Bilirubin Total: 0.4 mg/dL (ref 0.0–1.2)
CO2: 26 mmol/L (ref 20–29)
Calcium: 9 mg/dL (ref 8.7–10.2)
Chloride: 102 mmol/L (ref 96–106)
Creatinine, Ser: 0.81 mg/dL (ref 0.76–1.27)
Globulin, Total: 2.8 g/dL (ref 1.5–4.5)
Glucose: 105 mg/dL — ABNORMAL HIGH (ref 70–99)
Potassium: 3.9 mmol/L (ref 3.5–5.2)
Sodium: 142 mmol/L (ref 134–144)
Total Protein: 7.1 g/dL (ref 6.0–8.5)
eGFR: 104 mL/min/{1.73_m2} (ref >59)

## 2023-11-22 LAB — LIPID PANEL
Chol/HDL Ratio: 4.6 {ratio} (ref 0.0–5.0)
Cholesterol, Total: 182 mg/dL (ref 100–199)
HDL: 40 mg/dL (ref >39)
LDL Chol Calc (NIH): 106 mg/dL — ABNORMAL HIGH (ref 0–99)
Triglycerides: 208 mg/dL — ABNORMAL HIGH (ref 0–149)
VLDL Cholesterol Cal: 36 mg/dL (ref 5–40)

## 2023-11-22 LAB — HEMOGLOBIN A1C
Est. average glucose Bld gHb Est-mCnc: 123 mg/dL
Hgb A1c MFr Bld: 5.9 % — ABNORMAL HIGH (ref 4.8–5.6)

## 2023-11-22 LAB — PSA: Prostate Specific Ag, Serum: 1 ng/mL (ref 0.0–4.0)

## 2023-11-22 LAB — TSH: TSH: 1.79 u[IU]/mL (ref 0.450–4.500)

## 2023-11-22 LAB — VITAMIN D 25 HYDROXY (VIT D DEFICIENCY, FRACTURES): Vit D, 25-Hydroxy: 14 ng/mL — ABNORMAL LOW (ref 30.0–100.0)

## 2023-11-25 NOTE — Progress Notes (Signed)
Patient called.  Patient aware. Please tell him that his labs are good except his vitamin D level is low and B12 lower end of normal. He need to take one B12 1000 daily and Vitamin D 5000 daily for 4 months then one once a week.

## 2023-12-02 ENCOUNTER — Encounter: Payer: Self-pay | Admitting: Internal Medicine

## 2023-12-02 ENCOUNTER — Telehealth: Payer: Managed Care, Other (non HMO) | Admitting: Internal Medicine

## 2023-12-02 DIAGNOSIS — R509 Fever, unspecified: Secondary | ICD-10-CM | POA: Insufficient documentation

## 2023-12-02 DIAGNOSIS — K0889 Other specified disorders of teeth and supporting structures: Secondary | ICD-10-CM | POA: Diagnosis not present

## 2023-12-02 MED ORDER — AMPICILLIN 500 MG PO CAPS: 500.0000 mg | ORAL_CAPSULE | Freq: Four times a day (QID) | ORAL | 0 refills | Status: AC

## 2023-12-02 NOTE — Progress Notes (Signed)
   Acute Office Visit  Subjective:     Patient ID: ZEVEN KOCAK, male    DOB: Nov 02, 1971, 53 y.o.   MRN: [id number]  No chief complaint on file.   HPI Patient is in today for was seen by high fever 104 this morning. He says his right upper tooth hurt and it is slightly swollen. He says that due to money issue he could not see dentist but eventually he will see him.   Review of Systems  Constitutional:  Positive for fever.  HENT:         Right upper tooth pain        Objective:    There were no vitals taken for this visit.   Physical Exam No PE done because it was a video conference call.   No results found for any visits on 12/02/23.      Assessment & Plan:   Problem List Items Addressed This Visit       Other   Tooth pain   He probably has tooth infection, I will send ampicillin and he will follow with dentist.      Fever - Primary    No orders of the defined types were placed in this encounter.   No follow-ups on file.  Eloisa Northern, MD

## 2023-12-02 NOTE — Assessment & Plan Note (Signed)
He probably has tooth infection, I will send ampicillin and he will follow with dentist.

## 2023-12-09 DIAGNOSIS — M47816 Spondylosis without myelopathy or radiculopathy, lumbar region: Secondary | ICD-10-CM | POA: Diagnosis not present

## 2023-12-09 DIAGNOSIS — M549 Dorsalgia, unspecified: Secondary | ICD-10-CM | POA: Diagnosis not present

## 2023-12-09 DIAGNOSIS — Z1389 Encounter for screening for other disorder: Secondary | ICD-10-CM | POA: Diagnosis not present

## 2023-12-09 DIAGNOSIS — Z79891 Long term (current) use of opiate analgesic: Secondary | ICD-10-CM | POA: Diagnosis not present

## 2023-12-13 ENCOUNTER — Encounter: Payer: Self-pay | Admitting: Internal Medicine

## 2023-12-14 ENCOUNTER — Ambulatory Visit: Payer: Managed Care, Other (non HMO) | Admitting: Internal Medicine

## 2023-12-14 VITALS — BP 140/98 | HR 84 | Temp 98.6°F | Ht 78.0 in | Wt 323.4 lb

## 2023-12-14 DIAGNOSIS — I10 Essential (primary) hypertension: Secondary | ICD-10-CM

## 2023-12-14 DIAGNOSIS — R0989 Other specified symptoms and signs involving the circulatory and respiratory systems: Secondary | ICD-10-CM

## 2023-12-14 DIAGNOSIS — D352 Benign neoplasm of pituitary gland: Secondary | ICD-10-CM | POA: Diagnosis not present

## 2023-12-14 DIAGNOSIS — M79672 Pain in left foot: Secondary | ICD-10-CM | POA: Diagnosis not present

## 2023-12-14 MED ORDER — DICLOFENAC SODIUM 1 % EX GEL: 2.0000 g | Freq: Four times a day (QID) | CUTANEOUS | 0 refills | Status: AC

## 2023-12-14 NOTE — Assessment & Plan Note (Signed)
 He will apply Voltaren  gel to left foot.  There is no tenderness.

## 2023-12-14 NOTE — Progress Notes (Signed)
 Office Visit  Subjective   Patient ID: Carl Edwards   DOB: [birth year]   Age: 53 y.o.   MRN: [id number]   Chief Complaint Chief Complaint  Patient presents with   BP issues    Office visit     History of Present Illness 53 years old male is here c/o low BP off and on 89/55 few times at home, he stopped taking  losartan  2 days ago and his blood pressure is 140/98 today.  He says that he drink a lot of water.  He was lightheaded and could not see for few minutes when he stood up that is when he checked his blood pressure.    He also says something drop on his left foot 8 hurt for few days but then he got better but started hurting again when he walked.  Yesterday he could not put his weight on left foot.   He says that he has a history of adenoma of pituitary gland and worries that his vision might be related with that.  I have order MRI but he says that he never heard back from any body.  Past Medical History Past Medical History:  Diagnosis Date   Acute hypoxemic respiratory failure (HCC) 06/12/2018   Anxiety 03/24/2017   Bloody pleural effusion 06/12/2018   Chest pain in adult 11/13/2018   Complex partial seizure disorder (HCC) 04/19/2014   Depression 03/24/2017   Diverticulosis 03/24/2017   Hypertension    Knee pain, bilateral 03/24/2017   Migraine 04/19/2014   Morbid obesity with BMI of 40.0-44.9, adult (HCC) 12/06/2017   Nephrolithiasis 03/24/2017   Obstructive sleep apnea 04/19/2014   Pituitary tumor 06/13/2014   Pleural effusion on right    Postoperative intestinal malabsorption 06/12/2018   Posttraumatic arthropathy 01/20/2015   Restless legs syndrome 08/21/2014   S/P laparoscopic sleeve gastrectomy 06/12/2018   Shortness of breath on exertion 08/03/2018     Allergies Allergies  Allergen Reactions   Acetaminophen  Nausea Only     Review of Systems Review of Systems  Musculoskeletal:        Left foot pain  Neurological:  Positive for dizziness and headaches.       Objective:     Vitals BP (!) 140/98 (BP Location: Left Arm, Patient Position: Sitting)   Pulse 84   Temp 98.6 F (37 C)   Ht 6' 6 (1.981 m)   Wt (!) 323 lb 6 oz (146.7 kg)   SpO2 96%   BMI 37.37 kg/m    Physical Examination Physical Exam Constitutional:      Appearance: Normal appearance. He is obese.  Cardiovascular:     Rate and Rhythm: Normal rate and regular rhythm.     Heart sounds: Normal heart sounds.  Pulmonary:     Effort: Pulmonary effort is normal.     Breath sounds: Normal breath sounds.  Neurological:     General: No focal deficit present.     Mental Status: He is alert and oriented to person, place, and time.        Assessment & Plan:   Hypertension   He will drink plenty of water and continue taking losartan  50 mg daily.  If his blood pressure dropped again in spite of drinking plenty water I will do tilt-table study.  Left foot pain   He will apply Voltaren  gel to left foot.  There is no tenderness.  Pituitary adenoma (HCC)   I will repeat MRI brain.    No follow-ups  on file.   Roetta Dare, MD

## 2023-12-14 NOTE — Assessment & Plan Note (Signed)
 I will repeat MRI brain.

## 2023-12-14 NOTE — Assessment & Plan Note (Signed)
 He will drink plenty of water and continue taking losartan  50 mg daily.  If his blood pressure dropped again in spite of drinking plenty water I will do tilt-table study.

## 2023-12-22 ENCOUNTER — Other Ambulatory Visit: Payer: Self-pay

## 2023-12-22 DIAGNOSIS — J4520 Mild intermittent asthma, uncomplicated: Secondary | ICD-10-CM | POA: Diagnosis not present

## 2023-12-22 DIAGNOSIS — G4733 Obstructive sleep apnea (adult) (pediatric): Secondary | ICD-10-CM | POA: Diagnosis not present

## 2023-12-22 DIAGNOSIS — R5383 Other fatigue: Secondary | ICD-10-CM | POA: Diagnosis not present

## 2023-12-26 ENCOUNTER — Other Ambulatory Visit: Payer: Self-pay | Admitting: Internal Medicine

## 2023-12-26 ENCOUNTER — Encounter: Payer: Self-pay | Admitting: Internal Medicine

## 2023-12-26 MED ORDER — LORAZEPAM 0.5 MG PO TABS: 0.5000 mg | ORAL_TABLET | Freq: Once | ORAL | 0 refills | Status: AC

## 2024-01-06 DIAGNOSIS — M47816 Spondylosis without myelopathy or radiculopathy, lumbar region: Secondary | ICD-10-CM | POA: Diagnosis not present

## 2024-01-06 DIAGNOSIS — M549 Dorsalgia, unspecified: Secondary | ICD-10-CM | POA: Diagnosis not present

## 2024-01-06 DIAGNOSIS — Z79891 Long term (current) use of opiate analgesic: Secondary | ICD-10-CM | POA: Diagnosis not present

## 2024-01-06 DIAGNOSIS — Z1389 Encounter for screening for other disorder: Secondary | ICD-10-CM | POA: Diagnosis not present

## 2024-01-10 DIAGNOSIS — D352 Benign neoplasm of pituitary gland: Secondary | ICD-10-CM | POA: Diagnosis not present

## 2024-01-13 ENCOUNTER — Other Ambulatory Visit: Payer: Self-pay | Admitting: Internal Medicine

## 2024-01-18 DIAGNOSIS — H35373 Puckering of macula, bilateral: Secondary | ICD-10-CM | POA: Diagnosis not present

## 2024-01-27 ENCOUNTER — Other Ambulatory Visit: Payer: Self-pay

## 2024-01-27 DIAGNOSIS — D352 Benign neoplasm of pituitary gland: Secondary | ICD-10-CM

## 2024-01-27 MED ORDER — PROMETHAZINE HCL 25 MG PO TABS: 25.0000 mg | ORAL_TABLET | Freq: Three times a day (TID) | ORAL | 2 refills | Status: DC | PRN

## 2024-02-08 ENCOUNTER — Ambulatory Visit: Admitting: Internal Medicine

## 2024-02-08 ENCOUNTER — Encounter: Payer: Self-pay | Admitting: Internal Medicine

## 2024-02-08 VITALS — BP 144/90 | HR 77 | Temp 98.2°F | Resp 18 | Ht 78.0 in | Wt 313.0 lb

## 2024-02-08 DIAGNOSIS — G43009 Migraine without aura, not intractable, without status migrainosus: Secondary | ICD-10-CM

## 2024-02-08 DIAGNOSIS — D497 Neoplasm of unspecified behavior of endocrine glands and other parts of nervous system: Secondary | ICD-10-CM

## 2024-02-08 MED ORDER — TOPIRAMATE 50 MG PO TABS: 50.0000 mg | ORAL_TABLET | Freq: Two times a day (BID) | ORAL | 2 refills | Status: AC

## 2024-02-08 NOTE — Assessment & Plan Note (Signed)
 Cystic lesion was 2.5 mm enhancing ring in 2015 and it is 4 mm now. He does not want to see neurologist but ok to repeat MRI brain in a year.

## 2024-02-08 NOTE — Assessment & Plan Note (Signed)
 Topamax has helped him

## 2024-02-08 NOTE — Progress Notes (Signed)
   Office Visit  Subjective   Patient ID: Carl Edwards   DOB: 10-Aug-1971   Age: 53 y.o.   MRN: [id number]   Chief Complaint Chief Complaint  Patient presents with   office visit    Patient here for go over MRI results      History of Present Illness 53 years old male is here to discuss MRI brain result. He has MRI brain dedicated for pituitary lesion done 06/2014 showed 2.5 mm cystic lesion in pituitary with no symptoms associated with it. He was seen by neurologist Dr. Adella Hare who referred him to see endocrinologist and I have reviewed their notes. Plan was to have repeat MRI in 6-12 months. He has repeat MRI brain done in [place]. His cystic lesion is 4 mm now in 15 years increased to 1.5 mm. He has chronic migraine and that he still has and blurry vision for that eye doctor prescribed him new glasses but he is not using them.   Past Medical History Past Medical History:  Diagnosis Date   Acute hypoxemic respiratory failure (HCC) 06/12/2018   Anxiety 03/24/2017   Bloody pleural effusion 06/12/2018   Chest pain in adult 11/13/2018   Complex partial seizure disorder (HCC) 04/19/2014   Depression 03/24/2017   Diverticulosis 03/24/2017   Hypertension    Knee pain, bilateral 03/24/2017   Migraine 04/19/2014   Morbid obesity with BMI of 40.0-44.9, adult (HCC) 12/06/2017   Nephrolithiasis 03/24/2017   Obstructive sleep apnea 04/19/2014   Pituitary tumor 06/13/2014   Pleural effusion on right    Postoperative intestinal malabsorption 06/12/2018   Posttraumatic arthropathy 01/20/2015   Restless legs syndrome 08/21/2014   S/P laparoscopic sleeve gastrectomy 06/12/2018   Shortness of breath on exertion 08/03/2018     Allergies Allergies  Allergen Reactions   Acetaminophen Nausea Only     Review of Systems Review of Systems  Constitutional: Negative.   Respiratory: Negative.    Cardiovascular: Negative.   Neurological:  Positive for headaches.       Objective:    Vitals BP (!) 144/90 (BP  Location: Left Arm, Patient Position: Sitting, Cuff Size: Normal)   Pulse 77   Temp 98.2 F (36.8 C)   Resp 18   Ht 6\' 6"  (1.981 m)   Wt (!) 313 lb (142 kg)   SpO2 97%   BMI 36.17 kg/m    Physical Examination Physical Exam Constitutional:      Appearance: Normal appearance.  Cardiovascular:     Rate and Rhythm: Normal rate and regular rhythm.     Heart sounds: Normal heart sounds.  Pulmonary:     Effort: Pulmonary effort is normal.     Breath sounds: Normal breath sounds.  Neurological:     General: No focal deficit present.     Mental Status: He is alert and oriented to person, place, and time.        Assessment & Plan:   Migraine Topamax has helped him  Pituitary tumor Cystic lesion was 2.5 mm enhancing ring in 2015 and it is 4 mm now. He does not want to see neurologist but ok to repeat MRI brain in a year.    No follow-ups on file.   Eloisa Northern, MD

## 2024-02-17 ENCOUNTER — Ambulatory Visit: Payer: Managed Care, Other (non HMO) | Admitting: Internal Medicine

## 2024-02-20 ENCOUNTER — Ambulatory Visit: Admitting: Internal Medicine

## 2024-02-21 DIAGNOSIS — Z79891 Long term (current) use of opiate analgesic: Secondary | ICD-10-CM | POA: Diagnosis not present

## 2024-02-21 DIAGNOSIS — Z1389 Encounter for screening for other disorder: Secondary | ICD-10-CM | POA: Diagnosis not present

## 2024-02-21 DIAGNOSIS — M549 Dorsalgia, unspecified: Secondary | ICD-10-CM | POA: Diagnosis not present

## 2024-02-21 DIAGNOSIS — M47816 Spondylosis without myelopathy or radiculopathy, lumbar region: Secondary | ICD-10-CM | POA: Diagnosis not present

## 2024-03-06 ENCOUNTER — Other Ambulatory Visit: Payer: Self-pay | Admitting: Internal Medicine

## 2024-03-12 ENCOUNTER — Telehealth (INDEPENDENT_AMBULATORY_CARE_PROVIDER_SITE_OTHER): Payer: Self-pay | Admitting: Otolaryngology

## 2024-03-12 ENCOUNTER — Encounter (INDEPENDENT_AMBULATORY_CARE_PROVIDER_SITE_OTHER): Payer: Self-pay

## 2024-03-12 NOTE — Telephone Encounter (Signed)
 Called patient to schedule an appointment with Dr. Lydia Sams for and [facility] consult.  LVM and sent MyChart msg to call our office.

## 2024-03-17 ENCOUNTER — Other Ambulatory Visit: Payer: Self-pay | Admitting: Internal Medicine

## 2024-04-03 ENCOUNTER — Encounter (INDEPENDENT_AMBULATORY_CARE_PROVIDER_SITE_OTHER): Payer: Self-pay | Admitting: Otolaryngology

## 2024-04-03 ENCOUNTER — Ambulatory Visit (INDEPENDENT_AMBULATORY_CARE_PROVIDER_SITE_OTHER): Admitting: Otolaryngology

## 2024-04-03 VITALS — BP 131/84 | HR 85 | Ht 78.0 in | Wt 312.0 lb

## 2024-04-03 DIAGNOSIS — F17290 Nicotine dependence, other tobacco product, uncomplicated: Secondary | ICD-10-CM | POA: Diagnosis not present

## 2024-04-03 DIAGNOSIS — Z91198 Patient's noncompliance with other medical treatment and regimen for other reason: Secondary | ICD-10-CM

## 2024-04-03 DIAGNOSIS — Z789 Other specified health status: Secondary | ICD-10-CM

## 2024-04-03 DIAGNOSIS — F17200 Nicotine dependence, unspecified, uncomplicated: Secondary | ICD-10-CM

## 2024-04-03 DIAGNOSIS — G4733 Obstructive sleep apnea (adult) (pediatric): Secondary | ICD-10-CM

## 2024-04-03 NOTE — Progress Notes (Signed)
 Dear Dr. Ariel Begun, Here is my assessment for our mutual patient, Carl Edwards. Thank you for allowing me the opportunity to care for your patient. Please do not hesitate to contact me should you have any other questions. Sincerely, Dr. Milon Aloe  Otolaryngology Clinic Note Referring provider: Dr. Ariel Begun HPI:  Carl Edwards is a 53 y.o. male kindly referred by Dr. Ariel Begun for evaluation of obstructive sleep apnea.  Initial visit (03/2024): Initially diagnosed with OSA in 2002, used CPAP for several years. Then quit using it and then now re-trial it but he cannot tolerate it. BMI 36.06 Using CPAP: yes, currently using but taking it off. Cannot tolerate currently due to mouth dryness, he takes it off throughout the night. As a result, only getting about 2-3 hours of sleep/night Interventions: Multiple masks trialed, currently nasal mask. Insomnia: yes, but takes Magnesium and melatonin and helps significantly Chest surgery: no Denies dysphonia, dysphagia Workup so far: Sleep study  H&N Surgery: denies Personal or FHx of bleeding dz or anesthesia difficulty: no  GLP-1: Zepbound(?) AP/AC: no  Tobacco: dips  PMHx: Diverticulitis (with colon resection), Obesity s/p Sleeve Gastrectomy(?), OSA, Depression, Pituitary Adenoma, HTN  Independent Review of Additional Tests or Records:  Patricio Boop [facility]) 03/07/2024 Referral notes reviewed and uploaded or available in chart in media tab (Dr. Corita Diego) - noted OSA; not tolerating CPAP, ref to ENT for inspire CBC and CMP 11/21/2023: WBC 5.9, Plt 223; BUN/Cr 6/0.81 Sleep study: 01/11/2024: AHI 31, Central 0, lowest O2 64% Sleep study 02/29/2024: CPAP titration.   PMH/Meds/All/SocHx/FamHx/ROS:   Past Medical History:  Diagnosis Date   Acute hypoxemic respiratory failure (HCC) 06/12/2018   Anxiety 03/24/2017   Bloody pleural effusion 06/12/2018   Chest pain in adult 11/13/2018   Complex partial seizure disorder (HCC) 04/19/2014   Depression 03/24/2017    Diverticulosis 03/24/2017   Hypertension    Knee pain, bilateral 03/24/2017   Migraine 04/19/2014   Morbid obesity with BMI of 40.0-44.9, adult (HCC) 12/06/2017   Nephrolithiasis 03/24/2017   Obstructive sleep apnea 04/19/2014   Pituitary tumor 06/13/2014   Pleural effusion on right    Postoperative intestinal malabsorption 06/12/2018   Posttraumatic arthropathy 01/20/2015   Restless legs syndrome 08/21/2014   S/P laparoscopic sleeve gastrectomy 06/12/2018   Shortness of breath on exertion 08/03/2018     Past Surgical History:  Procedure Laterality Date   APPENDECTOMY     CARPAL TUNNEL RELEASE Left    COLON RESECTION     GASTRIC BYPASS     HERNIA REPAIR     KNEE SURGERY Bilateral    LITHOTRIPSY      Family History  Problem Relation Age of Onset   Heart Problems Mother    Pulmonary embolism Mother    Lung cancer Father    Lung cancer Maternal Grandfather      Social Connections: Unknown (03/19/2022)   Received from [employer], [facility]   Social Network    Social Network: Not on file      Current Outpatient Medications:    atorvastatin (LIPITOR) 20 MG tablet, TAKE 1 TABLET(20 MG) BY MOUTH EVERY DAY AT BEDTIME, Disp: 30 tablet, Rfl: 2   buPROPion  (WELLBUTRIN  XL) 150 MG 24 hr tablet, TAKE 1 TABLET(150 MG) BY MOUTH DAILY, Disp: 30 tablet, Rfl: 3   buPROPion  (WELLBUTRIN  XL) 300 MG 24 hr tablet, TAKE 1 TABLET(300 MG) BY MOUTH DAILY, Disp: 30 tablet, Rfl: 3   famotidine (PEPCID) 40 MG tablet, Take 40 mg by mouth 4 (four) times  daily., Disp: , Rfl:    fluticasone  (FLONASE ) 50 MCG/ACT nasal spray, SHAKE LIQUID AND USE 1 SPRAY IN EACH NOSTRIL DAILY, Disp: 16 g, Rfl: 1   losartan  (COZAAR ) 50 MG tablet, TAKE 1 TABLET(50 MG) BY MOUTH DAILY, Disp: 30 tablet, Rfl: 6   ofloxacin  (OCUFLOX ) 0.3 % ophthalmic solution, Place 1 drop into the right eye 4 (four) times daily., Disp: 5 mL, Rfl: 0   omeprazole (PRILOSEC) 40 MG capsule, TAKE 1 CAPSULE(40 MG) BY MOUTH EVERY DAY BEFORE A MEAL, Disp: 30  capsule, Rfl: 3   oxyCODONE (ROXICODONE) 15 MG immediate release tablet, Take 15 mg by mouth 4 (four) times daily as needed., Disp: , Rfl:    promethazine  (PHENERGAN ) 25 MG tablet, Take 1 tablet (25 mg total) by mouth every 8 (eight) hours as needed for nausea or vomiting. TAKE 1 TABLET(25 MG) BY MOUTH EVERY 8 HOURS AS NEEDED FOR NAUSEA OR VOMITING, Disp: 30 tablet, Rfl: 2   tamsulosin  (FLOMAX ) 0.4 MG CAPS capsule, Take 2 capsules (0.8 mg total) by mouth daily., Disp: 60 capsule, Rfl: 11   topiramate  (TOPAMAX ) 50 MG tablet, Take 1 tablet (50 mg total) by mouth 2 (two) times daily., Disp: 60 tablet, Rfl: 2   cariprazine  (VRAYLAR ) 1.5 MG capsule, Take 1 capsule (1.5 mg total) by mouth daily. (Patient not taking: Reported on 04/03/2024), Disp: 30 capsule, Rfl: 6   diclofenac  Sodium (VOLTAREN  ARTHRITIS PAIN) 1 % GEL, Apply 2 g topically 4 (four) times daily. (Patient not taking: Reported on 04/03/2024), Disp: 4 g, Rfl: 0   Physical Exam:   BP 131/84 (BP Location: Left Arm, Patient Position: Sitting, Cuff Size: Large)   Pulse 85   Ht 6\' 6"  (1.981 m)   Wt (!) 312 lb (141.5 kg)   SpO2 95%   BMI 36.06 kg/m   Salient findings:  CN II-XII intact Bilateral EAC clear and TM intact with well pneumatized middle ear spaces BMI 36 Anterior rhinoscopy: Septum intact; bilateral inferior turbinates without significant hypertrophy No lesions of oral cavity/oropharynx; tongue friedman 3 No obviously palpable neck masses/lymphadenopathy/thyromegaly; no neck scar No respiratory distress or stridor; TFL was indicated to better evaluate the proximal airway, given the patient's history and exam findings, and is detailed below.   Seprately Identifiable Procedures:  Prior to initiating any procedures, risks/benefits/alternatives were explained to the patient and verbal consent obtained. Procedure Note Pre-procedure diagnosis:  Obstructive sleep apnea, rule out structural cause Post-procedure diagnosis:  Same Procedure: Transnasal Fiberoptic Laryngoscopy, CPT 31575 - Mod 25 Indication: see above Complications: None apparent EBL: 0 mL  The procedure was undertaken to further evaluate the patient's complaint above, with mirror exam inadequate for appropriate examination due to gag reflex and poor patient tolerance  Procedure:  Patient was identified as correct patient. Verbal consent was obtained. The nose was sprayed with oxymetazoline and 4% lidocaine. The The flexible laryngoscope was passed through the nose to view the nasal cavity, pharynx (oropharynx, hypopharynx) and larynx.  The larynx was examined at rest and during multiple phonatory tasks. Documentation was obtained and reviewed with patient. The scope was removed. The patient tolerated the procedure well.  Findings: The nasal cavity and nasopharynx did not reveal any masses or lesions, mucosa appeared to be without obvious lesions. The tongue base, pharyngeal walls, piriform sinuses, vallecula, epiglottis and postcricoid region are normal in appearance; Muller maneuver negative, no obstructive masses. The visualized portion of the subglottis and proximal trachea is widely patent. The vocal folds are mobile bilaterally. There are  no lesions on the free edge of the vocal folds nor elsewhere in the larynx worrisome for malignancy.     Electronically signed by: Evelina Hippo, MD 04/15/2024 8:56 AM   Impression & Plans:  Viaan Knippenberg is a 53 y.o. male with:  1. OSA (obstructive sleep apnea)   2. Tobacco use disorder   3. Intolerance of continuous positive airway pressure (CPAP) ventilation     We discussed options including continued CPAP v/s Inspire. He is unable to tolerate CPAP despite multiple mask trials due to poor tolerance and taking his mask off.  He also has some insomnia, which I explained may make it more difficult for him to tolerate the inspire device.  However he does report benefit from his current sleep regimen and wishes  to proceed with the hypoglossal nerve stimulator We had a discussion regarding risks of Inspire including lack of benefit, persistent symptoms, pneumothorax, tongue soreness or weakness, Floor of mouth numbness, injury to major vessels, hematoma, implant infection, bleeding, scarring, tethering of neck, persistent symptoms, need for further procedures, and risk of anesthesia among others.   Given the patient's tobacco use, I also discussed cessation and options for cessation, including counseling. Counseled patient on the dangers of tobacco use, advised patient to stop tobacco use, and reviewed strategies to maximize success. Patient is not ready to quit, and declined further treatment. Total time spent with this was 4 minutes.    - Will post for DISE; if approved, then hypoglossal nerve stimulator  See below regarding exact medications prescribed this encounter including dosages and route: No orders of the defined types were placed in this encounter.     Thank you for allowing me the opportunity to care for your patient. Please do not hesitate to contact me should you have any other questions.  Sincerely, Milon Aloe, MD Otolaryngologist (ENT), [facility] Phone: [phone] Fax: [phone]  04/15/2024, 8:56 AM   MDM:  Level 4 Complexity/Problems addressed: mod - chronic problem, worsening Data complexity: mod - independent review of notes, labs, tests - Morbidity: mod - decision for surgery  - Prescription Drug prescribed or managed: no

## 2024-04-12 DIAGNOSIS — M47816 Spondylosis without myelopathy or radiculopathy, lumbar region: Secondary | ICD-10-CM | POA: Diagnosis not present

## 2024-04-16 ENCOUNTER — Other Ambulatory Visit: Payer: Self-pay

## 2024-04-16 ENCOUNTER — Encounter: Payer: Self-pay | Admitting: Internal Medicine

## 2024-04-16 MED ORDER — PROMETHAZINE HCL 25 MG PO TABS: 25.0000 mg | ORAL_TABLET | Freq: Three times a day (TID) | ORAL | 2 refills | Status: DC | PRN

## 2024-04-16 MED ORDER — OFLOXACIN 0.3 % OP SOLN: 1.0000 [drp] | Freq: Four times a day (QID) | OPHTHALMIC | 0 refills | Status: DC

## 2024-04-18 ENCOUNTER — Encounter (HOSPITAL_BASED_OUTPATIENT_CLINIC_OR_DEPARTMENT_OTHER): Payer: Self-pay | Admitting: *Deleted

## 2024-04-18 ENCOUNTER — Other Ambulatory Visit: Payer: Self-pay

## 2024-04-20 ENCOUNTER — Encounter (HOSPITAL_BASED_OUTPATIENT_CLINIC_OR_DEPARTMENT_OTHER)
Admission: RE | Admit: 2024-04-20 | Discharge: 2024-04-20 | Disposition: A | Discharge status: Home / Self Care | Source: Ambulatory Visit | Attending: Otolaryngology | Admitting: Otolaryngology

## 2024-04-20 DIAGNOSIS — I10 Essential (primary) hypertension: Secondary | ICD-10-CM | POA: Diagnosis not present

## 2024-04-20 DIAGNOSIS — Z01818 Encounter for other preprocedural examination: Secondary | ICD-10-CM | POA: Diagnosis present

## 2024-04-20 DIAGNOSIS — Z01810 Encounter for preprocedural cardiovascular examination: Secondary | ICD-10-CM | POA: Insufficient documentation

## 2024-04-23 ENCOUNTER — Encounter (HOSPITAL_BASED_OUTPATIENT_CLINIC_OR_DEPARTMENT_OTHER): Payer: Self-pay

## 2024-04-23 ENCOUNTER — Telehealth (INDEPENDENT_AMBULATORY_CARE_PROVIDER_SITE_OTHER): Payer: Self-pay | Admitting: Otolaryngology

## 2024-04-23 ENCOUNTER — Encounter (HOSPITAL_BASED_OUTPATIENT_CLINIC_OR_DEPARTMENT_OTHER)
Admission: RE | Disposition: A | Discharge status: Home / Self Care | Payer: Self-pay | Source: Home / Self Care | Attending: Otolaryngology

## 2024-04-23 ENCOUNTER — Ambulatory Visit (HOSPITAL_BASED_OUTPATIENT_CLINIC_OR_DEPARTMENT_OTHER)
Admission: RE | Admit: 2024-04-23 | Discharge: 2024-04-23 | Disposition: A | Discharge status: Home / Self Care | Attending: Otolaryngology | Admitting: Otolaryngology

## 2024-04-23 ENCOUNTER — Other Ambulatory Visit: Payer: Self-pay

## 2024-04-23 ENCOUNTER — Ambulatory Visit (HOSPITAL_BASED_OUTPATIENT_CLINIC_OR_DEPARTMENT_OTHER): Admitting: Anesthesiology

## 2024-04-23 DIAGNOSIS — Z91198 Patient's noncompliance with other medical treatment and regimen for other reason: Secondary | ICD-10-CM

## 2024-04-23 DIAGNOSIS — G40909 Epilepsy, unspecified, not intractable, without status epilepticus: Secondary | ICD-10-CM | POA: Diagnosis not present

## 2024-04-23 DIAGNOSIS — Z789 Other specified health status: Secondary | ICD-10-CM

## 2024-04-23 DIAGNOSIS — Z9884 Bariatric surgery status: Secondary | ICD-10-CM | POA: Diagnosis not present

## 2024-04-23 DIAGNOSIS — I10 Essential (primary) hypertension: Secondary | ICD-10-CM

## 2024-04-23 DIAGNOSIS — E669 Obesity, unspecified: Secondary | ICD-10-CM | POA: Diagnosis not present

## 2024-04-23 DIAGNOSIS — G4733 Obstructive sleep apnea (adult) (pediatric): Secondary | ICD-10-CM

## 2024-04-23 DIAGNOSIS — F419 Anxiety disorder, unspecified: Secondary | ICD-10-CM | POA: Diagnosis not present

## 2024-04-23 DIAGNOSIS — K219 Gastro-esophageal reflux disease without esophagitis: Secondary | ICD-10-CM | POA: Diagnosis not present

## 2024-04-23 DIAGNOSIS — Z79899 Other long term (current) drug therapy: Secondary | ICD-10-CM | POA: Diagnosis not present

## 2024-04-23 DIAGNOSIS — Z6836 Body mass index (BMI) 36.0-36.9, adult: Secondary | ICD-10-CM

## 2024-04-23 DIAGNOSIS — Z6837 Body mass index (BMI) 37.0-37.9, adult: Secondary | ICD-10-CM | POA: Insufficient documentation

## 2024-04-23 DIAGNOSIS — F32A Depression, unspecified: Secondary | ICD-10-CM | POA: Diagnosis not present

## 2024-04-23 HISTORY — PX: DRUG INDUCED ENDOSCOPY: SHX6808

## 2024-04-23 HISTORY — DX: Other complications of anesthesia, initial encounter: T88.59XA

## 2024-04-23 HISTORY — DX: Gastro-esophageal reflux disease without esophagitis: K21.9

## 2024-04-23 HISTORY — DX: Unspecified osteoarthritis, unspecified site: M19.90

## 2024-04-23 HISTORY — DX: Personal history of urinary calculi: Z87.442

## 2024-04-23 SURGERY — DRUG INDUCED SLEEP ENDOSCOPY
Anesthesia: Monitor Anesthesia Care | Site: Nose

## 2024-04-23 MED ORDER — ONDANSETRON HCL 4 MG/2ML IJ SOLN: 4.0000 mg | Freq: Once | INTRAMUSCULAR | Status: DC | PRN

## 2024-04-23 MED ORDER — OXYMETAZOLINE HCL 0.05 % NA SOLN
NASAL | Status: DC | PRN
  Administered 2024-04-23: 1 via TOPICAL

## 2024-04-23 MED ORDER — LACTATED RINGERS IV SOLN: INTRAVENOUS | Status: DC

## 2024-04-23 MED ORDER — PROPOFOL 10 MG/ML IV BOLUS
INTRAVENOUS | Status: DC | PRN
  Administered 2024-04-23 (×2): 10 mg via INTRAVENOUS

## 2024-04-23 MED ORDER — FENTANYL CITRATE (PF) 100 MCG/2ML IJ SOLN: 25.0000 ug | INTRAMUSCULAR | Status: DC | PRN

## 2024-04-23 MED ORDER — AMISULPRIDE (ANTIEMETIC) 5 MG/2ML IV SOLN: 10.0000 mg | Freq: Once | INTRAVENOUS | Status: DC | PRN

## 2024-04-23 MED ORDER — LIDOCAINE HCL (CARDIAC) PF 100 MG/5ML IV SOSY
PREFILLED_SYRINGE | INTRAVENOUS | Status: DC | PRN
  Administered 2024-04-23: 20 mg via INTRAVENOUS

## 2024-04-23 MED ORDER — PROPOFOL 500 MG/50ML IV EMUL
INTRAVENOUS | Status: DC | PRN
  Administered 2024-04-23: 100 ug/kg/min via INTRAVENOUS

## 2024-04-23 SURGICAL SUPPLY — 11 items
CANISTER SUCT 1200ML W/VALVE (MISCELLANEOUS) ×2 IMPLANT
GLOVE BIO SURGEON STRL SZ 6.5 (GLOVE) ×2 IMPLANT
KIT CLEAN ENDO (MISCELLANEOUS) ×2 IMPLANT
NDL PRECISIONGLIDE 27X1.5 (NEEDLE) IMPLANT
NEEDLE PRECISIONGLIDE 27X1.5 (NEEDLE) IMPLANT
PATTIES SURGICAL .5 X3 (DISPOSABLE) ×2 IMPLANT
SHEET MEDIUM DRAPE 40X70 STRL (DRAPES) ×2 IMPLANT
SOLUTION ANTFG W/FOAM PAD STRL (MISCELLANEOUS) ×2 IMPLANT
SYR CONTROL 10ML LL (SYRINGE) IMPLANT
TOWEL GREEN STERILE FF (TOWEL DISPOSABLE) ×2 IMPLANT
TUBE CONNECTING 20X1/4 (TUBING) IMPLANT

## 2024-04-23 NOTE — Anesthesia Postprocedure Evaluation (Signed)
 Anesthesia Post Note  Patient: Carl Edwards  Procedure(s) Performed: DRUG INDUCED SLEEP ENDOSCOPY (Nose)     Patient location during evaluation: PACU Anesthesia Type: MAC Level of consciousness: awake and alert Pain management: pain level controlled Vital Signs Assessment: post-procedure vital signs reviewed and stable Respiratory status: spontaneous breathing, nonlabored ventilation and respiratory function stable Cardiovascular status: blood pressure returned to baseline and stable Postop Assessment: no apparent nausea or vomiting Anesthetic complications: no   No notable events documented.  Last Vitals:  Vitals:   04/23/24 0715 04/23/24 0930  BP: (!) 149/97 (!) 148/97  Pulse: (!) 54 (!) 58  Resp: 17 16  Temp: 36.7 C 36.7 C  SpO2: 97% 96%    Last Pain:  Vitals:   04/23/24 0936  PainSc: 0-No pain                 Jacquelyne Matte

## 2024-04-23 NOTE — Telephone Encounter (Signed)
 ENT Note: DISE exam showed patient an anatomic candidate for hypoglossal nerve stimulator placement. Will post as he wishes to proceed. Carl Edwards B Emelia Sandoval

## 2024-04-23 NOTE — Anesthesia Preprocedure Evaluation (Addendum)
 Anesthesia Evaluation  Patient identified by MRN, date of birth, ID band Patient awake    Reviewed: Allergy & Precautions, H&P , NPO status , Patient's Chart, lab work & pertinent test results  History of Anesthesia Complications (+) PROLONGED EMERGENCE and history of anesthetic complications  Airway Mallampati: III  TM Distance: >3 FB Neck ROM: Full    Dental  (+) Teeth Intact, Dental Advisory Given, Chipped   Pulmonary sleep apnea    Pulmonary exam normal breath sounds clear to auscultation       Cardiovascular hypertension (149/97 preop), Pt. on medications Normal cardiovascular exam Rhythm:Regular Rate:Normal     Neuro/Psych  Headaches, Seizures -, Well Controlled,  PSYCHIATRIC DISORDERS Anxiety Depression       GI/Hepatic Neg liver ROS,GERD  Controlled,,S/p gastric sleeve 2019   Endo/Other  Obesity BMI 37  Renal/GU negative Renal ROS  negative genitourinary   Musculoskeletal  (+) Arthritis , Osteoarthritis,    Abdominal  (+) + obese  Peds negative pediatric ROS (+)  Hematology negative hematology ROS (+)   Anesthesia Other Findings   Reproductive/Obstetrics negative OB ROS                             Anesthesia Physical Anesthesia Plan  ASA: 3  Anesthesia Plan: MAC   Post-op Pain Management: Tylenol PO (pre-op)*   Induction:   PONV Risk Score and Plan: 1 and Propofol infusion, TIVA, Treatment may vary due to age or medical condition and Midazolam  Airway Management Planned: Natural Airway and Nasal Cannula  Additional Equipment: None  Intra-op Plan:   Post-operative Plan:   Informed Consent: I have reviewed the patients History and Physical, chart, labs and discussed the procedure including the risks, benefits and alternatives for the proposed anesthesia with the patient or authorized representative who has indicated his/her understanding and acceptance.     Dental  advisory given  Plan Discussed with: CRNA  Anesthesia Plan Comments:        Anesthesia Quick Evaluation

## 2024-04-23 NOTE — Discharge Instructions (Addendum)
 Post Anesthesia Guidelines Surgery Discharge Instructions:  Call clinic or return to ED if you: - develop a fever greater than 101.4 - have shaking chills or are feeling ill - become short of breath - have uncontrollable nausea or vomiting - can't hold down food or liquids or feel as though you are getting dehydrated - any other acute events, problems, or concerns  Wound Care/Dressings/Drain Instructions:  - Some blood tinged drainage from nose may occur for first few days. Any profuse bleeding from nose, please call our office You are a candidate for the inspire implant -- we will submit for insurance approval. Please call our office if you do not hear back from us  in about 2 weeks.  Medications: - Resume your regular home medications except as detailed in the medication reconciliation.   Activity/Restrictions:  - Resume your regular activities, as tolerated.   Diet: - Resume your regular diet, as tolerated   Post Anesthesia Home Care Instructions  Activity: Get plenty of rest for the remainder of the day. A responsible individual must stay with you for 24 hours following the procedure.  For the next 24 hours, DO NOT: -Drive a car -Advertising copywriter -Drink alcoholic beverages -Take any medication unless instructed by your physician -Make any legal decisions or sign important papers.  Meals: Start with liquid foods such as gelatin or soup. Progress to regular foods as tolerated. Avoid greasy, spicy, heavy foods. If nausea and/or vomiting occur, drink only clear liquids until the nausea and/or vomiting subsides. Call your physician if vomiting continues.  Special Instructions/Symptoms: Your throat may feel dry or sore from the anesthesia or the breathing tube placed in your throat during surgery. If this causes discomfort, gargle with warm salt water. The discomfort should disappear within 24 hours.  If you had a scopolamine patch placed behind your ear for the management of  post- operative nausea and/or vomiting:  1. The medication in the patch is effective for 72 hours, after which it should be removed.  Wrap patch in a tissue and discard in the trash. Wash hands thoroughly with soap and water. 2. You may remove the patch earlier than 72 hours if you experience unpleasant side effects which may include dry mouth, dizziness or visual disturbances. 3. Avoid touching the patch. Wash your hands with soap and water after contact with the patch.

## 2024-04-23 NOTE — Transfer of Care (Signed)
 Immediate Anesthesia Transfer of Care Note  Patient: Carl Edwards  Procedure(s) Performed: DRUG INDUCED SLEEP ENDOSCOPY (Nose)  Patient Location: PACU  Anesthesia Type:MAC  Level of Consciousness: awake, alert , and oriented  Airway & Oxygen Therapy: Patient Spontanous Breathing  Post-op Assessment: Report given to RN and Post -op Vital signs reviewed and stable  Post vital signs: Reviewed and stable  Last Vitals:  Vitals Value Taken Time  BP    Temp    Pulse    Resp    SpO2      Last Pain:  Vitals:   04/23/24 0715  PainSc: 5       Patients Stated Pain Goal: 5 (04/23/24 0715)  Complications: No notable events documented.

## 2024-04-23 NOTE — Op Note (Signed)
 Otolaryngology Operative note  Carl Edwards Date/Time of Admission: 04/23/2024  6:26 AM  CSN: [id number];MRN:[id number]  DOB: Oct 23, 1971 Age: 53 y.o. Location: [facility]    Pre-Op Diagnosis: OBSTRUCTIVE SLEEP APNEA INTOLERANCE OF CONTINUOUS POSITIVE AIRWAY PRESSURE VENTILATION   Post-Op Diagnosis: Same   Procedure: Procedure(s): DRUG INDUCED SLEEP ENDOSCOPY USING Floreen Hunger- CPT [phone]  Surgeon: Milon Aloe, MD  Anesthesia type:  MAC  Anesthesiologist: Anesthesiologist: Jacquelyne Matte, DO CRNA: Arvilla Birmingham, CRNA; Lonia Ro, CRNA   Staff: See Log  Implants: * No implants in log *  Specimens: None  EBL: minimal  Drains: None  Post-op disposition and condition: PACU, hemodynamically stable  Findings: There was no evidence of complete concentric palatal obstruction and patient is a candidate anatomically for hypoglossal nerve stimulation therapy.    Complications: None apparent  Indications and consent:  Carl Edwards is a 53 y.o. male with obstructive sleep apnea with intolerance of continuous positive airway pressure ventilation. As such, with a BMI of 36.7, patient's options were discussed including Hypoglossal nerve stimulator placement. Risks/benefits/alternatives for each option were discussed. Patient expressed understanding, and despite these risks, consented and decided to proceed with drug induced sleep endoscopy to determine stimulator placement candidacy. Informed consent was signed before proceeding.  Procedure: The patient was brought to the endoscopy room and was anesthetized via the standard drug-induced sleep endoscopy protocol using propofol pump. The room lights were dimmed. The propofol infusion rate was started at 50 mcg and gradually increased at which point, conditions that mimic sleep were gradually observed. Nose was decongested with afrin pledgets prior to anesthetic administration.   With the  patient not responsive to verbal commands, but still with spontaneous respiration, sleep disordered breathing events were clearly observed including snoring.   Under these conditions, the flexible endoscope was inserted to examine both sides of the nose as well as the pharynx and larynx.   There was primarily AP collapse (~50%) with trace lateral collapse with tongue base obstruction.  With simulated jaw thrust, the hypopharyngeal obstruction and secondarily the palatal collapse improved.   In summary, there was no evidence of complete concentric palatal obstruction and patient is a candidate anatomically for hypoglossal nerve stimulation therapy.   The anesthesia was then weaned and care transferred to anesthesia who transported patient to PACU in stable condition.   I was present for and performed the entire procedure.  Shaterrica Territo B Rashaan Wyles

## 2024-04-24 ENCOUNTER — Encounter (HOSPITAL_BASED_OUTPATIENT_CLINIC_OR_DEPARTMENT_OTHER): Payer: Self-pay | Admitting: Otolaryngology

## 2024-04-24 NOTE — H&P (Signed)
 Pre-Operative H&P - Day Of Surgery Patient Name: Carl Edwards Date:   04/24/2024  HPI: Carl Edwards is a 53 y.o. male who presents today for operative treatment of obstructive sleep apnea, intolerance of CPAP. Patient denies recent significant changes to health or significant new medications or physiologic change in condition which would immediately impact plans. No new types of therapy has been initiated that would change the plan or the appropriateness of the plan.   ROS:  A complete review of systems was obtained and is otherwise negative  PMH:  Past Medical History:  Diagnosis Date   Acute hypoxemic respiratory failure (HCC) 06/12/2018   Anxiety 03/24/2017   Arthritis    Bloody pleural effusion 06/12/2018   Chest pain in adult 11/13/2018   Complication of anesthesia    diff waking up   Depression 03/24/2017   Diverticulosis 03/24/2017   GERD (gastroesophageal reflux disease)    History of kidney stones    Hypertension    Knee pain, bilateral 03/24/2017   Migraine 04/19/2014   Morbid obesity with BMI of 40.0-44.9, adult (HCC) 12/06/2017   Obstructive sleep apnea 04/19/2014   does not use CPAP   Pituitary tumor 06/13/2014   Pleural effusion on right    Postoperative intestinal malabsorption 06/12/2018   Posttraumatic arthropathy 01/20/2015   Restless legs syndrome 08/21/2014   S/P laparoscopic sleeve gastrectomy 06/12/2018   Shortness of breath on exertion 08/03/2018    PSH:  Past Surgical History:  Procedure Laterality Date   APPENDECTOMY     CARPAL TUNNEL RELEASE Left    COLON RESECTION     DRUG INDUCED ENDOSCOPY N/A 04/23/2024   Procedure: DRUG INDUCED SLEEP ENDOSCOPY;  Surgeon: Evelina Hippo, MD;  Location: [facility];  Service: ENT;  Laterality: N/A;   GASTRIC BYPASS     HERNIA REPAIR     KNEE SURGERY Bilateral    LITHOTRIPSY      MEDS:  No current facility-administered medications for this encounter.  Current Outpatient Medications:     atorvastatin (LIPITOR) 20 MG tablet, TAKE 1 TABLET(20 MG) BY MOUTH EVERY DAY AT BEDTIME, Disp: 30 tablet, Rfl: 2   buPROPion  (WELLBUTRIN  XL) 150 MG 24 hr tablet, TAKE 1 TABLET(150 MG) BY MOUTH DAILY, Disp: 30 tablet, Rfl: 3   buPROPion  (WELLBUTRIN  XL) 300 MG 24 hr tablet, TAKE 1 TABLET(300 MG) BY MOUTH DAILY, Disp: 30 tablet, Rfl: 3   famotidine (PEPCID) 40 MG tablet, Take 40 mg by mouth 4 (four) times daily., Disp: , Rfl:    fluticasone  (FLONASE ) 50 MCG/ACT nasal spray, SHAKE LIQUID AND USE 1 SPRAY IN EACH NOSTRIL DAILY, Disp: 16 g, Rfl: 1   losartan  (COZAAR ) 50 MG tablet, TAKE 1 TABLET(50 MG) BY MOUTH DAILY, Disp: 30 tablet, Rfl: 6   ofloxacin  (OCUFLOX ) 0.3 % ophthalmic solution, Place 1 drop into the right eye 4 (four) times daily., Disp: 5 mL, Rfl: 0   omeprazole (PRILOSEC) 40 MG capsule, TAKE 1 CAPSULE(40 MG) BY MOUTH EVERY DAY BEFORE A MEAL, Disp: 30 capsule, Rfl: 3   oxyCODONE (ROXICODONE) 15 MG immediate release tablet, Take 15 mg by mouth 4 (four) times daily as needed., Disp: , Rfl:    promethazine  (PHENERGAN ) 25 MG tablet, Take 1 tablet (25 mg total) by mouth every 8 (eight) hours as needed for nausea or vomiting. TAKE 1 TABLET(25 MG) BY MOUTH EVERY 8 HOURS AS NEEDED FOR NAUSEA OR VOMITING, Disp: 30 tablet, Rfl: 2   tamsulosin  (FLOMAX ) 0.4 MG CAPS capsule, Take 2  capsules (0.8 mg total) by mouth daily., Disp: 60 capsule, Rfl: 11   tiZANidine (ZANAFLEX) 4 MG capsule, Take 4 mg by mouth 2 (two) times daily., Disp: , Rfl:    topiramate  (TOPAMAX ) 50 MG tablet, Take 1 tablet (50 mg total) by mouth 2 (two) times daily., Disp: 60 tablet, Rfl: 2   diclofenac  Sodium (VOLTAREN  ARTHRITIS PAIN) 1 % GEL, Apply 2 g topically 4 (four) times daily. (Patient not taking: Reported on 04/03/2024), Disp: 4 g, Rfl: 0  ALLERGIES: Acetaminophen  EXAM: Vitals: BP (!) 148/97 (BP Location: Right Arm)   Pulse (!) 58   Temp 98.1 F (36.7 C)   Resp 16   Ht 6' 6 (1.981 m)   Wt (!) 144.4 kg   SpO2 96%   BMI  36.79 kg/m   General Awake, at baseline alertness.   HEENT No scleral icterus or conjunctival hemorrhage. Globe position appears normal. External ears  normal. Nose patent without rhinorrhea. No lymphadenopathy. No thyromegaly  Cardiovascular No cyanosis.  Pulmonary No audible stridor. Breathing easily with no labor.  Neuro Symmetric facial movement.   Psychiatry Appropriate affect and mood.  Skin No scars or lesions on face or neck.  Extermities Moves all extremities with normal range of motion.   Other Findings None.   Assessment & Plan: Carl Edwards has diagnoses of obstructive sleep apnea, intolerance of CPAP and will go to the OR today for drug induced sleep endoscopy. Informed consent was obtained and available in EMR today. All questions have been answered, and risks/benefits/alternatives of procedure as noted in the consent were discussed in a quiet area. Questions were invited and answered. The patient expressed understanding, provided consent and wished to proceed despite risks.  Terrence Wishon B Sheliah Fiorillo 04/24/2024 7:48 AM

## 2024-05-18 ENCOUNTER — Other Ambulatory Visit (HOSPITAL_BASED_OUTPATIENT_CLINIC_OR_DEPARTMENT_OTHER): Payer: Self-pay

## 2024-05-18 MED ORDER — OXYCODONE HCL 15 MG PO TABS
15.0000 mg | ORAL_TABLET | Freq: Three times a day (TID) | ORAL | 0 refills | Status: DC | PRN
  Filled 2024-05-18: qty 90, 30d supply, fill #0

## 2024-06-17 ENCOUNTER — Other Ambulatory Visit: Payer: Self-pay | Admitting: Internal Medicine

## 2024-06-20 ENCOUNTER — Encounter: Payer: Self-pay | Admitting: Internal Medicine

## 2024-06-26 ENCOUNTER — Ambulatory Visit (INDEPENDENT_AMBULATORY_CARE_PROVIDER_SITE_OTHER): Admitting: Otolaryngology

## 2024-06-26 ENCOUNTER — Encounter (INDEPENDENT_AMBULATORY_CARE_PROVIDER_SITE_OTHER): Payer: Self-pay | Admitting: Otolaryngology

## 2024-06-26 VITALS — BP 156/101 | HR 82 | Ht 78.0 in | Wt 312.0 lb

## 2024-06-26 DIAGNOSIS — F17290 Nicotine dependence, other tobacco product, uncomplicated: Secondary | ICD-10-CM

## 2024-06-26 DIAGNOSIS — Z789 Other specified health status: Secondary | ICD-10-CM

## 2024-06-26 DIAGNOSIS — Z91198 Patient's noncompliance with other medical treatment and regimen for other reason: Secondary | ICD-10-CM

## 2024-06-26 DIAGNOSIS — F17200 Nicotine dependence, unspecified, uncomplicated: Secondary | ICD-10-CM

## 2024-06-26 DIAGNOSIS — G4733 Obstructive sleep apnea (adult) (pediatric): Secondary | ICD-10-CM | POA: Diagnosis not present

## 2024-06-26 NOTE — Progress Notes (Signed)
 Dear Dr. Caleen, Here is my assessment for our mutual patient, Carl Edwards. Thank you for allowing me the opportunity to care for your patient. Please do not hesitate to contact me should you have any other questions. Sincerely, Dr. Eldora Blanch  Otolaryngology Clinic Note Referring provider: Dr. Caleen HPI:  Carl Edwards is a 53 y.o. male kindly referred by Dr. Caleen for evaluation of obstructive sleep apnea.  Initial visit (03/2024): Initially diagnosed with OSA in 2002, used CPAP for several years. Then quit using it and then now re-trial it but he cannot tolerate it. BMI 36.06 Using CPAP: yes, currently using but taking it off. Cannot tolerate currently due to mouth dryness, he takes it off throughout the night. As a result, only getting about 2-3 hours of sleep/night Interventions: Multiple masks trialed, currently nasal mask. Insomnia: yes, but takes Magnesium and melatonin and helps significantly Chest surgery: no Denies dysphonia, dysphagia Workup so far: Sleep study  --------------------------------------------------------- 06/26/2024 Returns for follow up. We discussed his DISE and He had several questions prior to CN XII stim placement which I answered. PCP did not wish to see him pre-op and he's cleared. Cut down on dipping. Not on GLP 1   H&N Surgery: denies Personal or FHx of bleeding dz or anesthesia difficulty: no  GLP-1: Zepbound(?) AP/AC: no  Tobacco: dips  PMHx: Diverticulitis (with colon resection), Obesity s/p Sleeve Gastrectomy(?), OSA, Depression, Pituitary Adenoma, HTN  Independent Review of Additional Tests or Records:  Carl Edwards [facility]) 03/07/2024 Referral notes reviewed and uploaded or available in chart in media tab (Dr. Mardee) - noted OSA; not tolerating CPAP, ref to ENT for inspire CBC and CMP 11/21/2023: WBC 5.9, Plt 223; BUN/Cr 6/0.81 Sleep study: 01/11/2024: AHI 31, Central 0, lowest O2 64% Sleep study 02/29/2024: CPAP  titration.   PMH/Meds/All/SocHx/FamHx/ROS:   Past Medical History:  Diagnosis Date   Acute hypoxemic respiratory failure (HCC) 06/12/2018   Anxiety 03/24/2017   Arthritis    Bloody pleural effusion 06/12/2018   Chest pain in adult 11/13/2018   Complication of anesthesia    diff waking up   Depression 03/24/2017   Diverticulosis 03/24/2017   GERD (gastroesophageal reflux disease)    History of kidney stones    Hypertension    Knee pain, bilateral 03/24/2017   Migraine 04/19/2014   Morbid obesity with BMI of 40.0-44.9, adult (HCC) 12/06/2017   Obstructive sleep apnea 04/19/2014   does not use CPAP   Pituitary tumor 06/13/2014   Pleural effusion on right    Postoperative intestinal malabsorption 06/12/2018   Posttraumatic arthropathy 01/20/2015   Restless legs syndrome 08/21/2014   S/P laparoscopic sleeve gastrectomy 06/12/2018   Shortness of breath on exertion 08/03/2018     Past Surgical History:  Procedure Laterality Date   APPENDECTOMY     CARPAL TUNNEL RELEASE Left    COLON RESECTION     DRUG INDUCED ENDOSCOPY N/A 04/23/2024   Procedure: DRUG INDUCED SLEEP ENDOSCOPY;  Surgeon: Blanch Eldora NOVAK, MD;  Location: [facility];  Service: ENT;  Laterality: N/A;   GASTRIC BYPASS     HERNIA REPAIR     KNEE SURGERY Bilateral    LITHOTRIPSY      Family History  Problem Relation Age of Onset   Heart Problems Mother    Pulmonary embolism Mother    Lung cancer Father    Lung cancer Maternal Grandfather      Social Connections: Unknown (03/19/2022)   Received from [facility]   Social Network  Social Network: Not on file      Current Outpatient Medications:    atorvastatin (LIPITOR) 20 MG tablet, TAKE 1 TABLET(20 MG) BY MOUTH EVERY DAY AT BEDTIME, Disp: 30 tablet, Rfl: 2   buPROPion  (WELLBUTRIN  XL) 150 MG 24 hr tablet, TAKE 1 TABLET(150 MG) BY MOUTH DAILY, Disp: 30 tablet, Rfl: 3   buPROPion  (WELLBUTRIN  XL) 300 MG 24 hr tablet, TAKE 1 TABLET(300 MG)  BY MOUTH DAILY, Disp: 30 tablet, Rfl: 3   famotidine (PEPCID) 40 MG tablet, Take 40 mg by mouth 4 (four) times daily., Disp: , Rfl:    fluticasone  (FLONASE ) 50 MCG/ACT nasal spray, SHAKE LIQUID AND USE 1 SPRAY IN EACH NOSTRIL DAILY, Disp: 16 g, Rfl: 1   losartan  (COZAAR ) 50 MG tablet, TAKE 1 TABLET(50 MG) BY MOUTH DAILY, Disp: 30 tablet, Rfl: 6   ofloxacin  (OCUFLOX ) 0.3 % ophthalmic solution, INSTILL 1 DROP IN RIGHT EYE FOUR TIMES DAILY, Disp: 5 mL, Rfl: 0   omeprazole (PRILOSEC) 40 MG capsule, TAKE 1 CAPSULE(40 MG) BY MOUTH EVERY DAY BEFORE A MEAL, Disp: 30 capsule, Rfl: 3   oxyCODONE  (ROXICODONE ) 15 MG immediate release tablet, Take 15 mg by mouth 4 (four) times daily as needed., Disp: , Rfl:    oxyCODONE  (ROXICODONE ) 15 MG immediate release tablet, Take 1 tablet (15 mg total) by mouth every 8 (eight) hours as needed., Disp: 90 tablet, Rfl: 0   promethazine  (PHENERGAN ) 25 MG tablet, Take 1 tablet (25 mg total) by mouth every 8 (eight) hours as needed for nausea or vomiting. TAKE 1 TABLET(25 MG) BY MOUTH EVERY 8 HOURS AS NEEDED FOR NAUSEA OR VOMITING, Disp: 30 tablet, Rfl: 2   tamsulosin  (FLOMAX ) 0.4 MG CAPS capsule, Take 2 capsules (0.8 mg total) by mouth daily., Disp: 60 capsule, Rfl: 11   tiZANidine (ZANAFLEX) 4 MG capsule, Take 4 mg by mouth 2 (two) times daily., Disp: , Rfl:    topiramate  (TOPAMAX ) 50 MG tablet, Take 1 tablet (50 mg total) by mouth 2 (two) times daily., Disp: 60 tablet, Rfl: 2   diclofenac  Sodium (VOLTAREN  ARTHRITIS PAIN) 1 % GEL, Apply 2 g topically 4 (four) times daily. (Patient not taking: Reported on 06/26/2024), Disp: 4 g, Rfl: 0   Physical Exam:   BP (!) 156/101 (BP Location: Left Arm, Patient Position: Sitting, Cuff Size: Large)   Pulse 82   Ht 6' 6 (1.981 m)   Wt (!) 312 lb (141.5 kg)   SpO2 94%   BMI 36.06 kg/m   Salient findings:  CN II-XII intact Bilateral EAC clear and TM intact with well pneumatized middle ear spaces BMI 36 Anterior rhinoscopy: Septum  intact; bilateral inferior turbinates without significant hypertrophy No lesions of oral cavity/oropharynx; tongue friedman 3 No obviously palpable neck masses/lymphadenopathy/thyromegaly; no neck scar No respiratory distress or stridor.   Seprately Identifiable Procedures:  Prior to initiating any procedures, risks/benefits/alternatives were explained to the patient and verbal consent obtained. Procedure Note (prior, not today) Pre-procedure diagnosis:  Obstructive sleep apnea, rule out structural cause Post-procedure diagnosis: Same Procedure: Transnasal Fiberoptic Laryngoscopy, CPT 31575 - Mod 25 Indication: see above Complications: None apparent EBL: 0 mL  The procedure was undertaken to further evaluate the patient's complaint above, with mirror exam inadequate for appropriate examination due to gag reflex and poor patient tolerance  Procedure:  Patient was identified as correct patient. Verbal consent was obtained. The nose was sprayed with oxymetazoline  and 4% lidocaine . The The flexible laryngoscope was passed through the nose to view the nasal  cavity, pharynx (oropharynx, hypopharynx) and larynx.  The larynx was examined at rest and during multiple phonatory tasks. Documentation was obtained and reviewed with patient. The scope was removed. The patient tolerated the procedure well.  Findings: The nasal cavity and nasopharynx did not reveal any masses or lesions, mucosa appeared to be without obvious lesions. The tongue base, pharyngeal walls, piriform sinuses, vallecula, epiglottis and postcricoid region are normal in appearance; Muller maneuver negative, no obstructive masses. The visualized portion of the subglottis and proximal trachea is widely patent. The vocal folds are mobile bilaterally. There are no lesions on the free edge of the vocal folds nor elsewhere in the larynx worrisome for malignancy.     Electronically signed by: Eldora KATHEE Blanch, MD 06/26/2024 12:37  PM   Impression & Plans:  Carl Edwards is a 53 y.o. male with:  1. OSA (obstructive sleep apnea)   2. Intolerance of continuous positive airway pressure (CPAP) ventilation   3. Tobacco use disorder    We discussed options including continued CPAP v/s Inspire. He is unable to tolerate CPAP despite multiple mask trials due to poor tolerance and taking his mask off.  He also has some insomnia, which I explained may make it more difficult for him to tolerate the inspire device.  However he does report benefit from his current sleep regimen and wishes to proceed with the hypoglossal nerve stimulator We again had a discussion regarding risks of Inspire including lack of benefit, persistent symptoms, pneumothorax, tongue soreness or weakness, Floor of mouth numbness, injury to major vessels, hematoma, implant infection, bleeding, scarring, tethering of neck, persistent symptoms, need for further procedures, and risk of anesthesia among others.   He will try to stop dipping before surgery He wishes to proceed with Inspire implant; f/u 2 weeks post op   See below regarding exact medications prescribed this encounter including dosages and route: No orders of the defined types were placed in this encounter.     Thank you for allowing me the opportunity to care for your patient. Please do not hesitate to contact me should you have any other questions.  Sincerely, Eldora Blanch, MD Otolaryngologist (ENT), [facility] Phone: [phone] Fax: [phone]  06/26/2024, 12:37 PM   MDM:  Level 3 - 99213 Complexity/Problems addressed: low Data complexity: low - Morbidity: low - Prescription Drug prescribed or managed: no

## 2024-06-28 ENCOUNTER — Other Ambulatory Visit: Payer: Self-pay

## 2024-06-28 ENCOUNTER — Encounter (HOSPITAL_BASED_OUTPATIENT_CLINIC_OR_DEPARTMENT_OTHER): Payer: Self-pay | Admitting: Emergency Medicine

## 2024-07-04 ENCOUNTER — Ambulatory Visit (HOSPITAL_BASED_OUTPATIENT_CLINIC_OR_DEPARTMENT_OTHER): Admitting: Anesthesiology

## 2024-07-04 ENCOUNTER — Other Ambulatory Visit: Payer: Self-pay

## 2024-07-04 ENCOUNTER — Ambulatory Visit (HOSPITAL_BASED_OUTPATIENT_CLINIC_OR_DEPARTMENT_OTHER)
Admission: RE | Admit: 2024-07-04 | Discharge: 2024-07-04 | Disposition: A | Discharge status: Home / Self Care | Attending: Otolaryngology | Admitting: Otolaryngology

## 2024-07-04 ENCOUNTER — Encounter (HOSPITAL_BASED_OUTPATIENT_CLINIC_OR_DEPARTMENT_OTHER): Payer: Self-pay

## 2024-07-04 ENCOUNTER — Encounter (HOSPITAL_BASED_OUTPATIENT_CLINIC_OR_DEPARTMENT_OTHER)
Admission: RE | Disposition: A | Discharge status: Home / Self Care | Payer: Self-pay | Source: Home / Self Care | Attending: Otolaryngology

## 2024-07-04 ENCOUNTER — Ambulatory Visit (HOSPITAL_COMMUNITY)

## 2024-07-04 DIAGNOSIS — F32A Depression, unspecified: Secondary | ICD-10-CM | POA: Insufficient documentation

## 2024-07-04 DIAGNOSIS — I119 Hypertensive heart disease without heart failure: Secondary | ICD-10-CM | POA: Diagnosis not present

## 2024-07-04 DIAGNOSIS — Z789 Other specified health status: Secondary | ICD-10-CM | POA: Insufficient documentation

## 2024-07-04 DIAGNOSIS — F419 Anxiety disorder, unspecified: Secondary | ICD-10-CM | POA: Diagnosis not present

## 2024-07-04 DIAGNOSIS — Z91198 Patient's noncompliance with other medical treatment and regimen for other reason: Secondary | ICD-10-CM

## 2024-07-04 DIAGNOSIS — I10 Essential (primary) hypertension: Secondary | ICD-10-CM | POA: Diagnosis not present

## 2024-07-04 DIAGNOSIS — E66812 Obesity, class 2: Secondary | ICD-10-CM | POA: Diagnosis not present

## 2024-07-04 DIAGNOSIS — F418 Other specified anxiety disorders: Secondary | ICD-10-CM

## 2024-07-04 DIAGNOSIS — J45909 Unspecified asthma, uncomplicated: Secondary | ICD-10-CM

## 2024-07-04 DIAGNOSIS — E669 Obesity, unspecified: Secondary | ICD-10-CM | POA: Insufficient documentation

## 2024-07-04 DIAGNOSIS — J939 Pneumothorax, unspecified: Secondary | ICD-10-CM | POA: Insufficient documentation

## 2024-07-04 DIAGNOSIS — G4733 Obstructive sleep apnea (adult) (pediatric): Secondary | ICD-10-CM | POA: Diagnosis not present

## 2024-07-04 DIAGNOSIS — M1990 Unspecified osteoarthritis, unspecified site: Secondary | ICD-10-CM | POA: Diagnosis not present

## 2024-07-04 DIAGNOSIS — R519 Headache, unspecified: Secondary | ICD-10-CM | POA: Insufficient documentation

## 2024-07-04 DIAGNOSIS — Z6836 Body mass index (BMI) 36.0-36.9, adult: Secondary | ICD-10-CM

## 2024-07-04 DIAGNOSIS — R569 Unspecified convulsions: Secondary | ICD-10-CM | POA: Insufficient documentation

## 2024-07-04 DIAGNOSIS — K219 Gastro-esophageal reflux disease without esophagitis: Secondary | ICD-10-CM | POA: Diagnosis not present

## 2024-07-04 DIAGNOSIS — Z9884 Bariatric surgery status: Secondary | ICD-10-CM | POA: Diagnosis not present

## 2024-07-04 HISTORY — PX: IMPLANTATION OF HYPOGLOSSAL NERVE STIMULATOR: SHX6827

## 2024-07-04 HISTORY — DX: Unspecified asthma, uncomplicated: J45.909

## 2024-07-04 SURGERY — INSERTION, HYPOGLOSSAL NERVE STIMULATOR
Anesthesia: General | Site: Neck | Laterality: Right

## 2024-07-04 MED ORDER — PROPOFOL 10 MG/ML IV BOLUS
INTRAVENOUS | Status: DC | PRN
  Administered 2024-07-04 (×2): 100 mg via INTRAVENOUS
  Administered 2024-07-04: 50 mg via INTRAVENOUS
  Administered 2024-07-04: 200 mg via INTRAVENOUS

## 2024-07-04 MED ORDER — DEXMEDETOMIDINE HCL IN NACL 80 MCG/20ML IV SOLN
INTRAVENOUS | Status: DC | PRN
  Administered 2024-07-04: 12 ug via INTRAVENOUS

## 2024-07-04 MED ORDER — CEFAZOLIN SODIUM 10 G IJ SOLR
INTRAMUSCULAR | Status: DC | PRN
  Administered 2024-07-04: 3 g via INTRAVENOUS

## 2024-07-04 MED ORDER — LACTATED RINGERS IV SOLN: INTRAVENOUS | Status: DC

## 2024-07-04 MED ORDER — SODIUM CHLORIDE 0.9 % IV SOLN
INTRAVENOUS | Status: AC
  Filled 2024-07-04: qty 10

## 2024-07-04 MED ORDER — ONDANSETRON HCL 4 MG/2ML IJ SOLN
INTRAMUSCULAR | Status: AC
  Filled 2024-07-04: qty 2

## 2024-07-04 MED ORDER — ACETAMINOPHEN 500 MG PO TABS
1000.0000 mg | ORAL_TABLET | Freq: Four times a day (QID) | ORAL | 0 refills | Status: AC | PRN
Start: 2024-07-04 — End: 2024-07-14

## 2024-07-04 MED ORDER — DEXAMETHASONE SODIUM PHOSPHATE 10 MG/ML IJ SOLN
INTRAMUSCULAR | Status: AC
  Filled 2024-07-04: qty 1

## 2024-07-04 MED ORDER — CEPHALEXIN 500 MG PO CAPS: 500.0000 mg | ORAL_CAPSULE | Freq: Two times a day (BID) | ORAL | 0 refills | Status: AC

## 2024-07-04 MED ORDER — SODIUM CHLORIDE 0.9 % IV SOLN
INTRAVENOUS | Status: DC | PRN
  Administered 2024-07-04: 500 mL

## 2024-07-04 MED ORDER — PROMETHAZINE HCL 25 MG PO TABS: 25.0000 mg | ORAL_TABLET | Freq: Three times a day (TID) | ORAL | 0 refills | Status: DC | PRN

## 2024-07-04 MED ORDER — PHENYLEPHRINE 80 MCG/ML (10ML) SYRINGE FOR IV PUSH (FOR BLOOD PRESSURE SUPPORT)
PREFILLED_SYRINGE | INTRAVENOUS | Status: AC
  Filled 2024-07-04: qty 10

## 2024-07-04 MED ORDER — LACTATED RINGERS IV SOLN: INTRAVENOUS | Status: DC | PRN

## 2024-07-04 MED ORDER — IBUPROFEN 200 MG PO TABS
400.0000 mg | ORAL_TABLET | Freq: Four times a day (QID) | ORAL | 2 refills | Status: AC | PRN
Start: 2024-07-04 — End: 2025-07-04

## 2024-07-04 MED ORDER — 0.9 % SODIUM CHLORIDE (POUR BTL) OPTIME
TOPICAL | Status: DC | PRN
  Administered 2024-07-04: 1000 mL

## 2024-07-04 MED ORDER — MIDAZOLAM HCL 5 MG/5ML IJ SOLN
INTRAMUSCULAR | Status: DC | PRN
  Administered 2024-07-04: 2 mg via INTRAVENOUS

## 2024-07-04 MED ORDER — PHENYLEPHRINE HCL-NACL 20-0.9 MG/250ML-% IV SOLN
INTRAVENOUS | Status: DC | PRN
  Administered 2024-07-04: 20 ug/min via INTRAVENOUS

## 2024-07-04 MED ORDER — FENTANYL CITRATE (PF) 100 MCG/2ML IJ SOLN: 25.0000 ug | INTRAMUSCULAR | Status: DC | PRN

## 2024-07-04 MED ORDER — LIDOCAINE 2% (20 MG/ML) 5 ML SYRINGE
INTRAMUSCULAR | Status: AC
  Filled 2024-07-04: qty 5

## 2024-07-04 MED ORDER — SUCCINYLCHOLINE CHLORIDE 200 MG/10ML IV SOSY
PREFILLED_SYRINGE | INTRAVENOUS | Status: AC
  Filled 2024-07-04: qty 10

## 2024-07-04 MED ORDER — MIDAZOLAM HCL 2 MG/2ML IJ SOLN
INTRAMUSCULAR | Status: AC
  Filled 2024-07-04: qty 2

## 2024-07-04 MED ORDER — GLYCOPYRROLATE PF 0.2 MG/ML IJ SOSY
PREFILLED_SYRINGE | INTRAMUSCULAR | Status: DC | PRN
Start: 2024-07-04 — End: 2024-07-04
  Administered 2024-07-04: .2 mg via INTRAVENOUS

## 2024-07-04 MED ORDER — OXYCODONE HCL 5 MG PO TABS
5.0000 mg | ORAL_TABLET | Freq: Once | ORAL | Status: AC | PRN
  Administered 2024-07-04: 5 mg via ORAL

## 2024-07-04 MED ORDER — AMISULPRIDE (ANTIEMETIC) 5 MG/2ML IV SOLN: 10.0000 mg | Freq: Once | INTRAVENOUS | Status: DC | PRN

## 2024-07-04 MED ORDER — FENTANYL CITRATE (PF) 100 MCG/2ML IJ SOLN
INTRAMUSCULAR | Status: DC | PRN
  Administered 2024-07-04 (×2): 50 ug via INTRAVENOUS
  Administered 2024-07-04: 100 ug via INTRAVENOUS

## 2024-07-04 MED ORDER — DEXAMETHASONE SODIUM PHOSPHATE 10 MG/ML IJ SOLN
INTRAMUSCULAR | Status: DC | PRN
  Administered 2024-07-04: 20 mg via INTRAVENOUS

## 2024-07-04 MED ORDER — SUCCINYLCHOLINE CHLORIDE 200 MG/10ML IV SOSY
PREFILLED_SYRINGE | INTRAVENOUS | Status: DC | PRN
Start: 2024-07-04 — End: 2024-07-04
  Administered 2024-07-04: 140 mg via INTRAVENOUS

## 2024-07-04 MED ORDER — HYDROMORPHONE HCL 1 MG/ML IJ SOLN
INTRAMUSCULAR | Status: AC
  Filled 2024-07-04: qty 0.5

## 2024-07-04 MED ORDER — OXYCODONE HCL 5 MG PO TABS: 5.0000 mg | ORAL_TABLET | Freq: Four times a day (QID) | ORAL | 0 refills | Status: DC | PRN

## 2024-07-04 MED ORDER — PROPOFOL 500 MG/50ML IV EMUL
INTRAVENOUS | Status: DC | PRN
Start: 2024-07-04 — End: 2024-07-04
  Administered 2024-07-04: 200 ug/kg/min via INTRAVENOUS
  Administered 2024-07-04: 100 ug/kg/min via INTRAVENOUS

## 2024-07-04 MED ORDER — ONDANSETRON HCL 4 MG/2ML IJ SOLN
INTRAMUSCULAR | Status: AC
Start: 2024-07-04 — End: 2024-07-04
  Filled 2024-07-04: qty 2

## 2024-07-04 MED ORDER — OXYCODONE HCL 5 MG PO TABS
ORAL_TABLET | ORAL | Status: AC
  Filled 2024-07-04: qty 1

## 2024-07-04 MED ORDER — KETOROLAC TROMETHAMINE 30 MG/ML IJ SOLN
INTRAMUSCULAR | Status: AC
  Filled 2024-07-04: qty 1

## 2024-07-04 MED ORDER — LIDOCAINE 2% (20 MG/ML) 5 ML SYRINGE
INTRAMUSCULAR | Status: DC | PRN
Start: 2024-07-04 — End: 2024-07-04
  Administered 2024-07-04: 100 mg via INTRAVENOUS

## 2024-07-04 MED ORDER — FENTANYL CITRATE (PF) 100 MCG/2ML IJ SOLN
INTRAMUSCULAR | Status: AC
  Filled 2024-07-04: qty 2

## 2024-07-04 MED ORDER — ONDANSETRON HCL 4 MG/2ML IJ SOLN: 4.0000 mg | Freq: Once | INTRAMUSCULAR | Status: DC | PRN

## 2024-07-04 MED ORDER — PROPOFOL 500 MG/50ML IV EMUL
INTRAVENOUS | Status: AC
  Filled 2024-07-04: qty 100

## 2024-07-04 MED ORDER — ONDANSETRON HCL 4 MG/2ML IJ SOLN
INTRAMUSCULAR | Status: DC | PRN
  Administered 2024-07-04 (×2): 4 mg via INTRAVENOUS

## 2024-07-04 MED ORDER — LIDOCAINE-EPINEPHRINE 1 %-1:100000 IJ SOLN
INTRAMUSCULAR | Status: DC | PRN
  Administered 2024-07-04: 8 mL

## 2024-07-04 SURGICAL SUPPLY — 70 items
BAG DECANTER FOR FLEXI CONT (MISCELLANEOUS) IMPLANT
BLADE CLIPPER SURG (BLADE) IMPLANT
BLADE SURG 15 STRL LF DISP TIS (BLADE) ×2 IMPLANT
CANISTER SUCT 1200ML W/VALVE (MISCELLANEOUS) ×2 IMPLANT
CHLORAPREP W/TINT 26 (MISCELLANEOUS) ×4 IMPLANT
CLIP TI WIDE RED SMALL 6 (CLIP) IMPLANT
CORD BIPOLAR FORCEPS 12FT (ELECTRODE) ×2 IMPLANT
COVER PROBE CYLINDRICAL 5X96 (MISCELLANEOUS) ×2 IMPLANT
DERMABOND ADVANCED .7 DNX12 (GAUZE/BANDAGES/DRESSINGS) ×4 IMPLANT
DRAPE C-ARM 35X43 STRL (DRAPES) ×2 IMPLANT
DRAPE HEAD BAR (DRAPES) IMPLANT
DRAPE INCISE IOBAN 66X45 STRL (DRAPES) ×2 IMPLANT
DRAPE MICROSCOPE WILD 40.5X102 (DRAPES) IMPLANT
DRAPE UTILITY XL STRL (DRAPES) ×2 IMPLANT
DRSG TEGADERM 2-3/8X2-3/4 SM (GAUZE/BANDAGES/DRESSINGS) ×4 IMPLANT
DRSG TEGADERM 4X4.75 (GAUZE/BANDAGES/DRESSINGS) ×2 IMPLANT
ELECT COATED BLADE 2.86 ST (ELECTRODE) ×2 IMPLANT
ELECTRODE EMG 18 NIMS (NEUROSURGERY SUPPLIES) ×2 IMPLANT
ELECTRODE REM PT RTRN 9FT ADLT (ELECTROSURGICAL) ×2 IMPLANT
FORCEPS BIPOLAR SPETZLER 8 1.0 (NEUROSURGERY SUPPLIES) ×2 IMPLANT
GAUZE 4X4 16PLY ~~LOC~~+RFID DBL (SPONGE) ×2 IMPLANT
GAUZE SPONGE 4X4 12PLY STRL (GAUZE/BANDAGES/DRESSINGS) ×2 IMPLANT
GENERATOR PULSE INSPIRE (Generator) ×1 IMPLANT
GENERATOR PULSE INSPIRE IV (Generator) ×2 IMPLANT
GLOVE BIO SURGEON STRL SZ 6.5 (GLOVE) ×2 IMPLANT
GLOVE BIO SURGEON STRL SZ7.5 (GLOVE) ×2 IMPLANT
GLOVE BIOGEL PI IND STRL 7.5 (GLOVE) IMPLANT
GLOVE SURG SS PI 7.0 STRL IVOR (GLOVE) IMPLANT
GOWN STRL REUS W/ TWL LRG LVL3 (GOWN DISPOSABLE) ×6 IMPLANT
GOWN STRL REUS W/ TWL XL LVL3 (GOWN DISPOSABLE) IMPLANT
HOOK RETRACT STAY BLUNT 12 (MISCELLANEOUS) IMPLANT
IV CATH 18G SAFETY (IV SOLUTION) ×2 IMPLANT
KIT NEURO ACCESSORY W/WRENCH (MISCELLANEOUS) IMPLANT
LEAD SENSING RESP INSPIRE (Lead) ×1 IMPLANT
LEAD SENSING RESP INSPIRE IV (Lead) ×2 IMPLANT
LEAD SLEEP STIM INSPIRE IV/V (Lead) ×2 IMPLANT
LEAD SLEEP STIMULATION INSPIRE (Lead) ×1 IMPLANT
LOOP VASCLR MAXI BLUE 18IN ST (MISCELLANEOUS) ×2 IMPLANT
LOOP VESSEL MINI RED (MISCELLANEOUS) ×2 IMPLANT
LOOPS VASCLR MAXI BLUE 18IN ST (MISCELLANEOUS) ×1 IMPLANT
MARKER SKIN DUAL TIP RULER LAB (MISCELLANEOUS) ×2 IMPLANT
NDL HYPO 25X1 1.5 SAFETY (NEEDLE) ×2 IMPLANT
NEEDLE HYPO 25X1 1.5 SAFETY (NEEDLE) ×1 IMPLANT
NS IRRIG 1000ML POUR BTL (IV SOLUTION) ×2 IMPLANT
PACK BASIN DAY SURGERY FS (CUSTOM PROCEDURE TRAY) ×2 IMPLANT
PACK ENT DAY SURGERY (CUSTOM PROCEDURE TRAY) ×2 IMPLANT
PASSER CATH 36 CODMAN DISP (NEUROSURGERY SUPPLIES) IMPLANT
PASSER CATH 38CM DISP (INSTRUMENTS) IMPLANT
PENCIL SMOKE EVACUATOR (MISCELLANEOUS) ×2 IMPLANT
PROBE NERVE STIMULATOR (NEUROSURGERY SUPPLIES) ×2 IMPLANT
REMOTE CONTROL SLEEP INSPIRE (MISCELLANEOUS) ×2 IMPLANT
SET WALTER ACTIVATION W/DRAPE (SET/KITS/TRAYS/PACK) ×2 IMPLANT
SLEEVE SCD COMPRESS KNEE MED (STOCKING) ×2 IMPLANT
SPONGE INTESTINAL PEANUT (DISPOSABLE) ×2 IMPLANT
SUT MNCRL AB 4-0 PS2 18 (SUTURE) IMPLANT
SUT MON AB 5-0 PS2 18 (SUTURE) ×4 IMPLANT
SUT SILK 2 0 SH (SUTURE) IMPLANT
SUT SILK 2 0 SH CR/8 (SUTURE) ×4 IMPLANT
SUT SILK 3 0 RB1 (SUTURE) IMPLANT
SUT SILK 3 0 REEL (SUTURE) IMPLANT
SUT VIC AB 3-0 SH 27X BRD (SUTURE) IMPLANT
SUT VIC AB 4-0 PS2 27 (SUTURE) IMPLANT
SUT VIC AB 4-0 RB1 18 (SUTURE) ×4 IMPLANT
SUT VIC AB 4-0 RB1 27X BRD (SUTURE) IMPLANT
SUT VIC AB 4-0 SH 18 (SUTURE) IMPLANT
SUT VICRYL 3-0 CR8 SH (SUTURE) ×2 IMPLANT
SUTURE SILK 3-0 RB1 30XBRD (SUTURE) IMPLANT
SYR 10ML LL (SYRINGE) ×4 IMPLANT
SYR BULB EAR ULCER 3OZ GRN STR (SYRINGE) ×2 IMPLANT
TOWEL GREEN STERILE FF (TOWEL DISPOSABLE) ×4 IMPLANT

## 2024-07-04 NOTE — H&P (Signed)
 Pre-Operative H&P - Day Of Surgery Patient Name: Carl Edwards Date:   07/04/2024  HPI: Carl Edwards is a 53 y.o. male who presents today for operative treatment of obstructive sleep apnea, intolerance of continuous positive airway pressure ventilation. Patient denies recent significant changes to health or significant new medications or physiologic change in condition which would immediately impact plans. No new types of therapy has been initiated that would change the plan or the appropriateness of the plan.   ROS:  A complete review of systems was obtained and is otherwise negative.   PMH:  Past Medical History:  Diagnosis Date   Acute hypoxemic respiratory failure (HCC) 06/12/2018   Anxiety 03/24/2017   Arthritis    Asthma    Bloody pleural effusion 06/12/2018   Chest pain in adult 11/13/2018   Complication of anesthesia    diff waking up   Depression 03/24/2017   Diverticulosis 03/24/2017   GERD (gastroesophageal reflux disease)    History of kidney stones    Hypertension    Knee pain, bilateral 03/24/2017   Migraine 04/19/2014   Morbid obesity with BMI of 40.0-44.9, adult (HCC) 12/06/2017   Obstructive sleep apnea 04/19/2014   does not use CPAP   Pituitary tumor 06/13/2014   Pleural effusion on right    Postoperative intestinal malabsorption 06/12/2018   Posttraumatic arthropathy 01/20/2015   Restless legs syndrome 08/21/2014   S/P laparoscopic sleeve gastrectomy 06/12/2018   Shortness of breath on exertion 08/03/2018    PSH:  Past Surgical History:  Procedure Laterality Date   APPENDECTOMY  2019   CARPAL TUNNEL RELEASE Left    COLON RESECTION  2002   DRUG INDUCED ENDOSCOPY N/A 04/23/2024   Procedure: DRUG INDUCED SLEEP ENDOSCOPY;  Surgeon: Tobie Eldora NOVAK, MD;  Location: [facility];  Service: ENT;  Laterality: N/A;   GASTRIC BYPASS  2019   HERNIA REPAIR  2002   HIP SURGERY Left    KNEE SURGERY Bilateral    LITHOTRIPSY      MEDS:   Current  Facility-Administered Medications:    lactated ringers  infusion, , Intravenous, Continuous, Corinne Garnette BRAVO, MD, Last Rate: 10 mL/hr at 07/04/24 1145, New Bag at 07/04/24 1145  ALLERGIES: Acetaminophen   EXAM: Vitals: BP (!) 151/96   Pulse 61   Temp 98.4 F (36.9 C) (Temporal)   Resp 18   Ht 6' 6 (1.981 m)   Wt (!) 144.3 kg   SpO2 98%   BMI 36.76 kg/m   General Awake, at baseline alertness.   HEENT No scleral icterus or conjunctival hemorrhage. Globe position appears normal. External ears  normal. Nose patent without rhinorrhea. No lymphadenopathy. No thyromegaly  Cardiovascular No cyanosis.  Pulmonary No audible stridor. Breathing easily with no labor.  Neuro Symmetric facial movement.   Psychiatry Appropriate affect and mood.  Skin No scars or lesions on face or neck.  Extermities Moves all extremities with normal range of motion.   Other Findings None.   Assessment & Plan: Carl Edwards has diagnoses of obstructive sleep apnea, intolerance of continuous positive airway pressure ventilation and will go to the OR today for right hypoglossal nerve stimulator placement. Informed consent was obtained and available in EMR today. All questions have been answered, and risks/benefits/alternatives of procedure as noted in the consent were discussed in a quiet area. Questions were invited and answered. The patient expressed understanding, provided consent and wished to proceed despite risks.  Eldora NOVAK Tobie 07/04/2024 12:27 PM

## 2024-07-04 NOTE — Anesthesia Postprocedure Evaluation (Signed)
 Anesthesia Post Note  Patient: Carl Edwards  Procedure(s) Performed: RIGHT HYPOGLOSSAL NERVE STIMULATOR PLACEMENT (Right: Neck)     Patient location during evaluation: PACU Anesthesia Type: General Level of consciousness: awake and alert Pain management: pain level controlled Vital Signs Assessment: post-procedure vital signs reviewed and stable Respiratory status: spontaneous breathing, nonlabored ventilation and respiratory function stable Cardiovascular status: blood pressure returned to baseline and stable Postop Assessment: no apparent nausea or vomiting Anesthetic complications: no   No notable events documented.  Last Vitals:  Vitals:   07/04/24 1630 07/04/24 1642  BP: (!) 170/93 (!) 146/100  Pulse: 72 71  Resp: 15 18  Temp:  36.9 C  SpO2: 90% 93%    Last Pain:  Vitals:   07/04/24 1649  TempSrc:   PainSc: 6                  Garnette FORBES [place]

## 2024-07-04 NOTE — Transfer of Care (Signed)
 Immediate Anesthesia Transfer of Care Note  Patient: Carl Edwards  Procedure(s) Performed: RIGHT HYPOGLOSSAL NERVE STIMULATOR PLACEMENT (Right: Neck)  Patient Location: PACU  Anesthesia Type:General  Level of Consciousness: awake, alert , oriented, and patient cooperative  Airway & Oxygen Therapy: Patient Spontanous Breathing and Patient connected to face mask oxygen  Post-op Assessment: Report given to RN and Post -op Vital signs reviewed and stable  Post vital signs: Reviewed and stable  Last Vitals:  Vitals Value Taken Time  BP 165/87 07/04/24 15:46  Temp 37.2 C 07/04/24 15:45  Pulse 81 07/04/24 15:59  Resp 17 07/04/24 15:59  SpO2 94 % 07/04/24 15:59  Vitals shown include unfiled device data.  Last Pain:  Vitals:   07/04/24 1555  TempSrc:   PainSc: 0-No pain      Patients Stated Pain Goal: 5 (07/04/24 1143)  Complications: No notable events documented.

## 2024-07-04 NOTE — Discharge Instructions (Addendum)
 Post Anesthesia Guidelines  During recovery from anesthesia  You may feel drowsy and reflexes may be slowed for 24 hours:  -Do not drive, use machinery, appliances, ride bicycles or scooters  -Do not consume alcohol  -Do not make important decisions   Eating and drinking:  -Drink plenty of liquids today  -Avoid fried or spicy foods today  -Return to your regular diet slowly over the next 24 hours  -Please call if unable to keep fluids down  If your throat is sore: -A breathing tube may have been used during your surgery -Drink cool liquids or gargle with warm salt water -If soreness lasts more than a few days, please call us   Surgery Discharge Instructions:  Call clinic or return to ED if you: - develop a fever greater than 101.4 - have shaking chills or are feeling ill - become short of breath - have uncontrollable nausea or vomiting - can't hold down food or liquids or feel as though you are getting dehydrated - have significant leakage or drainage from wound - urine output of less than 30cc/hr for 12 hours - develop significant redness, pain at incision(s) or wound opens up/separates - any other acute events, problems, or concerns  Wound Care/Dressings/Drain Instructions:   Your incisions are closed with skin glue. It will peel off on its own in 1-2 weeks. You can let water run over it in 48 hours like in a shower and gently pat dry. Avoid rubbing the incision or submerging under like like a bath or swimming pool.  - It is ok to shower in 48 hours, but avoid rubbing the incision or submerging under water like in a bath or swimming pool - Your stitches are absorbable and will dissolve on their own  - In about 2-3 days, do gentle neck rolls (5 rolls twice per day) to prevent any tethering of the neck  Medications: - Resume your regular home medications except as detailed in the medication reconciliation.  - For pain, take tylenol  1000mg  every 6 hours and ibuprofen  400mg   every 6 hours; If that is not sufficient, a stronger pain medication has been prescribed to you (oxycodone  5mg  tablet every 4-6 hours - take 5mg  if moderate pain, 10 if severe). Do not mix with any other narcotic medication such as the tramadol you were prescribed recently. Take phenergan  as needed for nausea To prevent infection, take Keflex  500mg  twice daily for 5 days  Follow Up:  - A follow up appointment should be scheduled for you after discharge. However, If you do not hear about your appoinment in 3 business days, please call the provided surgery clinic number and confirm/schedule your appointment.    Activity/Restrictions:  - Resume your regular activities, as tolerated. Avoid heavy lifting or straining (more than 5 lbs) for 10 days.  Diet: - Resume your regular diet, as tolerated  Additional Instructions: - Please take an over the counter stool softener while taking narcotic pain medication - DO NOT MIX NARCOTIC PAIN MEDICATIONS OR TAKE NARCOTIC PRESCRIPTIONS AT THE SAME TIME - DO NOT DRIVE OR OPERATE HEAVY MACHINERY WHILE ON NARCOTICS  - DO NOT TAKE MORE THAN 4 GRAMS (4000mg ) OF TYLENOL  (ACETAMINOPHEN ) IN 58 HOURS  Department of Otolaryngology Contact Info: Otolaryngology Front Desk Phone including questions about appointments or questions: [phone] - If after normal business hours (Monday-Friday after 5PM or Weekends/Holidays), please call same number and follow prompts for Patient Access Line. There is a physician on call for urgent matters. For life threatening  emergencies, please call 911    Post Anesthesia Home Care Instructions  Activity: Get plenty of rest for the remainder of the day. A responsible individual must stay with you for 24 hours following the procedure.  For the next 24 hours, DO NOT: -Drive a car -Advertising copywriter -Drink alcoholic beverages -Take any medication unless instructed by your physician -Make any legal decisions or sign important  papers.  Meals: Start with liquid foods such as gelatin or soup. Progress to regular foods as tolerated. Avoid greasy, spicy, heavy foods. If nausea and/or vomiting occur, drink only clear liquids until the nausea and/or vomiting subsides. Call your physician if vomiting continues.  Special Instructions/Symptoms: Your throat may feel dry or sore from the anesthesia or the breathing tube placed in your throat during surgery. If this causes discomfort, gargle with warm salt water. The discomfort should disappear within 24 hours.

## 2024-07-04 NOTE — Op Note (Signed)
 Otolaryngology Operative note  BILAL MANZER Date/Time of Admission: 07/04/2024 11:14 AM  CSN: [id number];FMW:[id number]  DOB: 06-28-1971 Age: 53 y.o. Location: [facility]    Pre-Op Diagnosis: Moderate to Severe Obstructive Sleep Apnea (ICD-10 G47.33) Intolerance of Continuous Positive Airway Pressure Ventilation  Post-Op Diagnosis: Same  Procedure: RIGHT 12th cranial nerve (hypoglossal) stimulation implant with placement of chest wall respiratory sensor (CPT [phone]).   Surgeon: Eldora Blanch, MD  Anesthesia type:  General  Anesthesiologist: Anesthesiologist: Corinne Garnette BRAVO, MD CRNA: Denton Niels CROME, CRNA   Staff: Circulator: Elaine Avelina PARAS, RN Relief Circulator: McDonough-Hughes, Jodi C, RN Scrub Person: Alto Charmaine PARAS Vendor Representative : Jandzinski, Emily Circulator Assistant: Eliberto Geroge HERO, RN RN First Assistant: Starla Duwaine BROCKS, RN OR Clinical Technician: Michael, Lorenzo Jr.  Implants: Implant Name Type Inv. Item Serial No. Manufacturer Lot No. LRB No. Used Action  GENERATOR PULSE INSPIRE - [id number] Generator GENERATOR PULSE INSPIRE [id number] [employer]  Right 1 Implanted  LEAD SLEEP STIMULATION INSPIRE - [id number] Lead LEAD SLEEP STIMULATION INSPIRE [id number] [employer]  Right 1 Implanted  LEAD SENSING RESP INSPIRE - [id number] Lead LEAD SENSING RESP INSPIRE [id number] [employer]  Right 1 Implanted    Specimens: None  EBL: 25cc  Drains: None  Post-op disposition and condition: PACU, hemodynamically stable   Findings:  Normal but adipose neck and chest anatomy with successful implantation of hypoglossal stimulator. Diagnostic evaluation confirmed an appropriate respiration sensing signal as well as activation of the genioglossus nerve, resulting in genioglossal activation and tongue protrusion, confirmed visually.   Complications: None apparent  Indications and consent:  Carl Edwards is a  53 y.o. male with history of moderate to severe obstructive sleep apnea with a BMI of 36 and intolerance of continuous positive airway pressure ventilation therapy. Patient has passed the clinical, polysomnographic and endoscopic screening criteria for implantation. The patient's options were discussed, including risks/benefits/alternatives for each option. Patient expressed understanding, and despite these risks, consented and decided to proceed with above procedures. Informed consent was signed before proceeding.   Procedure: The patient was brought to the Operating Room and was anesthetized via general endotracheal anesthesia without complication.  A shoulder roll was placed and the patient was prepped and draped in usual sterile fashion with the head turned to the left. Prior to prepping and draping, electrodes were placed in the genioglossus and hyoglossus muscle and connected to the NIM box for intraoperative nerve monitoring. Intraoperative antibiotics and steroids were administered.   1% lidocaine  with 1:100000 epinephrine  was injected in the planned and marked submandibular and chest incisions.  The patient was then prepped and draped in the standard fashion for this procedure.     A modified sub-mandibular incision was made in the right upper neck halfway between the hyoid bone and the inferior border of the mandible, about 1cm lateral from midline and about 5cm in length. Dissection was carried down through the subcutaneous tissue and platysma. The inferior border of the submandibular gland was identified as well as the digastric tendon. The submandibular gland and the overlying fascia with the marginal mandibular nerve were retracted superiorly and laterally.  The digastric tendon was retracted inferiorly using two vessel loops. Dissection was carried down under the mylohyoid muscle where the hypoglossal nerve was identified in its usual fashion.    The mylohyoid muscle was retracted anteriorly,  and the hypoglossal nerve was dissected medially. The lateral branches to retrusor muscles were identified, and tested intra-operatively  using the bipolar NIM stimulator. The superior/posterior branches innervating the hyoglossus muscle were identified using the NIM stimulator and anatomical cues.  The cuff electrode for the hypoglossal nerve stimulator was placed distally to these branches innervating the genioglossus, transverse, and vertical muscles. C1 branch was included. The stimulation lead was anchored to the digastric tendon using two 3-0 silk sutures. The wound bed was then irrigated with antibiotic irrigation. The lead body slack between the cuff and the anchor was gently tucked deep to the submandibular gland.   A second 5 cm incision was made in the right upper chest over the second intercostal space, approximately 3cm lateral to the sternal margin. Dissection was carried down through the skin and subcutaneous tissue to the fascia of the pectoralis muscle. An inferior pocket for the generator was created deep to the subcutaneous layer and superficial to the fascia of the pectoralis muscle.  The pectoralis major fascia was dissected directly over the second intercostal space with subsequent blunt dissection through the muscle.  The pectoralis major and minor were then retracted to expose the fatty layer just superficial to the external intercostal muscles.  The fatty layer was carefully swept away to expose the external intercostal muscles. A throw-down base knot was placed to the fascia of the external intercostals just lateral to the anterior external membrane using 3-0 silk suture.  The external intercostals were gently dissected, approximately 5 mm lateral to the suture knot and the respiratory sense lead was advanced with the sensor facing the pleura into the interfascial plane between the external and internal intercostals. The primary anchor was sutured into place with 3-0 silk on the external  intercostals.  The secondary anchor was sutured with 3-0 silk to the pectoralis major with some slack between the anchors.   The stimulation lead was then tunneled in a subplatysmal plane with blunt dissection under direct visualization and brought out into the sub-clavicular pocket where both the stimulation lead and the respiratory sensing lead were connected to the implantable pulse generator.   The implantable pulse generator was placed in the subclavicular pocket, ensuring the lead body was deep to the generator and secured with use of air knots to the pectoralis fascia using 2-0 silk sutures.  Diagnostic evaluation confirmed good placement of the stimulation cuff as demonstrated by activation of the genioglossus and transverse and vertical muscles, resulting in tongue protrusion, confirmed visually. Diagnostic evaluation also confirmed good respiratory sensor placement as demonstrated by a sensing waveform with rise and fall associated with patient respirations.   The wound bed was then irrigated with antibiotic irrigation and hemostasis was noted adequate.   The chest and neck wound was then closed in three layers with deep 3-0 and 4-0 vicryl sutures and a 5-0 subcuticular monocryl followed by dermabond.The patient was then awakened, extubated, and transferred to Recovery Room in stable condition. Post-operative xrays were obtained in the recovery room   Eldora KATHEE Blanch

## 2024-07-04 NOTE — Anesthesia Procedure Notes (Signed)
 Procedure Name: Intubation Date/Time: 07/04/2024 12:51 PM  Performed by: Denton Niels CROME, CRNAPre-anesthesia Checklist: Patient identified, Emergency Drugs available, Suction available and Patient being monitored Patient Re-evaluated:Patient Re-evaluated prior to induction Oxygen Delivery Method: Circle system utilized Preoxygenation: Pre-oxygenation with 100% oxygen Induction Type: IV induction Ventilation: Mask ventilation without difficulty Laryngoscope Size: Mac and 3 Grade View: Grade I Tube type: Oral Tube size: 7.0 mm Number of attempts: 1 Airway Equipment and Method: Stylet Placement Confirmation: ETT inserted through vocal cords under direct vision, positive ETCO2 and breath sounds checked- equal and bilateral Secured at: 29 cm Tube secured with: Tape Dental Injury: Teeth and Oropharynx as per pre-operative assessment  Comments: During movement/positioning of patient, ETT migrated slightly causing ineffective ventilation. Removed patient from vent; manual vent successful. Dr Corinne notified for assistance. Glidescope visualization confirmed ETT migration, ETT was re-positioned using glide scope. ETT secured. BBSE.

## 2024-07-04 NOTE — Anesthesia Preprocedure Evaluation (Addendum)
 Anesthesia Evaluation  Patient identified by MRN, date of birth, ID band Patient awake    Reviewed: Allergy & Precautions, NPO status , Patient's Chart, lab work & pertinent test results  Airway Mallampati: II  TM Distance: >3 FB Neck ROM: Full    Dental  (+) Dental Advisory Given, Missing, Chipped, Poor Dentition   Pulmonary asthma , sleep apnea    Pulmonary exam normal breath sounds clear to auscultation       Cardiovascular hypertension, Pt. on medications Normal cardiovascular exam Rhythm:Regular Rate:Normal     Neuro/Psych  Headaches, Seizures -, Well Controlled,  PSYCHIATRIC DISORDERS Anxiety Depression    Pituitary tumor    GI/Hepatic Neg liver ROS,GERD  Medicated,,Gastric bypass    Endo/Other  Obesity   Renal/GU negative Renal ROS     Musculoskeletal  (+) Arthritis ,    Abdominal   Peds  Hematology negative hematology ROS (+)   Anesthesia Other Findings Day of surgery medications reviewed with the patient.  Reproductive/Obstetrics                              Anesthesia Physical Anesthesia Plan  ASA: 3  Anesthesia Plan: General   Post-op Pain Management: Toradol  IV (intra-op)*   Induction: Intravenous  PONV Risk Score and Plan: 3 and Midazolam , Dexamethasone  and Ondansetron   Airway Management Planned: Oral ETT  Additional Equipment:   Intra-op Plan:   Post-operative Plan: Extubation in OR  Informed Consent: I have reviewed the patients History and Physical, chart, labs and discussed the procedure including the risks, benefits and alternatives for the proposed anesthesia with the patient or authorized representative who has indicated his/her understanding and acceptance.     Dental advisory given  Plan Discussed with: CRNA  Anesthesia Plan Comments:          Anesthesia Quick Evaluation

## 2024-07-05 ENCOUNTER — Encounter (HOSPITAL_BASED_OUTPATIENT_CLINIC_OR_DEPARTMENT_OTHER): Payer: Self-pay | Admitting: Otolaryngology

## 2024-07-06 ENCOUNTER — Telehealth (INDEPENDENT_AMBULATORY_CARE_PROVIDER_SITE_OTHER): Payer: Self-pay | Admitting: Otolaryngology

## 2024-07-06 MED ORDER — OXYCODONE HCL 5 MG PO TABS: 10.0000 mg | ORAL_TABLET | Freq: Four times a day (QID) | ORAL | 0 refills | Status: AC | PRN

## 2024-07-06 NOTE — Telephone Encounter (Signed)
 ENT Note: Mr. Deziel called the office reporting that his chest incision was hurting him more today. Neck incision pain is fairly well controlled. He is taking 10mg  of oxycodone  and it is helping but he will run out over the long weekend. He denies any deep chest pain, no shortness of breath. He reports mild incisional swelling as expected but no ecchymosis, chest or neck blowing up or feeling tight. No fevers. He cannot come to clinic this afternoon for evaluation so I asked him to send me several photos of the incisions which he did which did not show significant periincisional ecchymosis, swelling concerning for hematoma (he reports everything is soft). He istaking his antibiotics and not having fevers. As such, we discussed return precautions (as above - significant swelling, fevers, incisional issues), and will get him a appt on Tuesday for f/u He was agreeable to this Eldora KATHEE Blanch

## 2024-07-10 ENCOUNTER — Encounter (INDEPENDENT_AMBULATORY_CARE_PROVIDER_SITE_OTHER): Admitting: Physician Assistant

## 2024-07-16 ENCOUNTER — Other Ambulatory Visit: Payer: Self-pay | Admitting: Internal Medicine

## 2024-07-18 ENCOUNTER — Encounter (INDEPENDENT_AMBULATORY_CARE_PROVIDER_SITE_OTHER): Payer: Self-pay | Admitting: Otolaryngology

## 2024-07-18 ENCOUNTER — Ambulatory Visit (INDEPENDENT_AMBULATORY_CARE_PROVIDER_SITE_OTHER): Admitting: Otolaryngology

## 2024-07-18 VITALS — BP 170/100 | HR 83 | Ht 78.0 in

## 2024-07-18 DIAGNOSIS — G4733 Obstructive sleep apnea (adult) (pediatric): Secondary | ICD-10-CM

## 2024-07-18 DIAGNOSIS — Z9889 Other specified postprocedural states: Secondary | ICD-10-CM

## 2024-07-18 DIAGNOSIS — Z789 Other specified health status: Secondary | ICD-10-CM

## 2024-07-18 MED ORDER — CEPHALEXIN 500 MG PO CAPS: 500.0000 mg | ORAL_CAPSULE | Freq: Two times a day (BID) | ORAL | 0 refills | Status: AC

## 2024-07-18 NOTE — Progress Notes (Signed)
 S/p Hypoglossal nerve stimulator placement on 07/04/2024 S: Doing well, pain fairly well controlled; no fevers or issue with wound. No tongue weakness, no lip weakness. No SOB O: Tongue movement intact; no lower lip weakness; neck and chest incision c/d/I, no evidence of infection but small chest seroma present which was drained after verbal consent was obtained and drained with a 18G needle (approx 15 cc). Pressure dressing applied A/P: 53 y.o. y.o. w/ OSA s/p Hypoglossal nerve stimulator placement on 07/04/2024 Doing well, no evidence of infection Small seroma drained Recommend continued gentle neck rolls Will do keflex  500mg  BID x1 week F/u 1 week with PA for wound recheck   Eldora KATHEE Blanch

## 2024-07-20 ENCOUNTER — Encounter: Payer: Self-pay | Admitting: Internal Medicine

## 2024-07-20 ENCOUNTER — Ambulatory Visit: Admitting: Internal Medicine

## 2024-07-20 VITALS — BP 158/88 | HR 71 | Temp 97.6°F | Ht 78.0 in | Wt 321.0 lb

## 2024-07-20 DIAGNOSIS — F3342 Major depressive disorder, recurrent, in full remission: Secondary | ICD-10-CM | POA: Diagnosis not present

## 2024-07-20 DIAGNOSIS — Z23 Encounter for immunization: Secondary | ICD-10-CM | POA: Diagnosis not present

## 2024-07-20 DIAGNOSIS — I10 Essential (primary) hypertension: Secondary | ICD-10-CM | POA: Diagnosis not present

## 2024-07-20 DIAGNOSIS — E538 Deficiency of other specified B group vitamins: Secondary | ICD-10-CM

## 2024-07-20 DIAGNOSIS — E785 Hyperlipidemia, unspecified: Secondary | ICD-10-CM | POA: Diagnosis not present

## 2024-07-20 DIAGNOSIS — G4733 Obstructive sleep apnea (adult) (pediatric): Secondary | ICD-10-CM | POA: Diagnosis not present

## 2024-07-20 MED ORDER — LOSARTAN POTASSIUM 100 MG PO TABS: 100.0000 mg | ORAL_TABLET | Freq: Every day | ORAL | 6 refills | Status: DC

## 2024-07-20 NOTE — Assessment & Plan Note (Signed)
 He take B12 supplement.

## 2024-07-20 NOTE — Assessment & Plan Note (Signed)
 His blood pressure is uncontrolled so I will increase losartan  to 100 mg daily.  He will come back in 1 week for CMP drawn.

## 2024-07-20 NOTE — Progress Notes (Signed)
 Office Visit  Subjective   Patient ID: Carl Edwards   DOB: 12/03/1970   Age: 53 y.o.   MRN: [id number]   Chief Complaint Chief Complaint  Patient presents with   Follow-up    Follow up      History of Present Illness 53 years old male is here for follow up.   He has hypo glossal nerve stimulator placed on August 27 but that area got infected.  The have drain 50 cc fluid and started him on antibiotic.  He will follow-up with ENT.  They have not turned that stimulator on yet.  He has obstructive sleep apnea and he could not use CPAP.    He has hypertension and his blood pressure has been high.  He take losartan  50 mg daily.  He has blood pressure was low so I have stopped his hydrochlorothiazide.  His blood pressure today is 158/88.   He says that he has a history of adenoma of pituitary gland and   I have referred him to ENT but he have not seen them he says that a lot is going on.    He has a history of depression and his depression is better.  He take bupropion  450 mg daily.    He also has hyperlipidemia and he take atorvastatin 20 mg daily.  I have reviewed his lipid panel that was done February 2025.  Past Medical History Past Medical History:  Diagnosis Date   Acute hypoxemic respiratory failure (HCC) 06/12/2018   Anxiety 03/24/2017   Arthritis    Asthma    Bloody pleural effusion 06/12/2018   Chest pain in adult 11/13/2018   Complication of anesthesia    diff waking up   Depression 03/24/2017   Diverticulosis 03/24/2017   GERD (gastroesophageal reflux disease)    History of kidney stones    Hypertension    Knee pain, bilateral 03/24/2017   Migraine 04/19/2014   Morbid obesity with BMI of 40.0-44.9, adult (HCC) 12/06/2017   Obstructive sleep apnea 04/19/2014   does not use CPAP   Pituitary tumor 06/13/2014   Pleural effusion on right    Postoperative intestinal malabsorption 06/12/2018   Posttraumatic arthropathy 01/20/2015   Restless legs syndrome 08/21/2014    S/P laparoscopic sleeve gastrectomy 06/12/2018   Shortness of breath on exertion 08/03/2018     Allergies Allergies  Allergen Reactions   Acetaminophen  Nausea Only     Review of Systems Review of Systems  Constitutional: Negative.   HENT: Negative.    Respiratory: Negative.    Cardiovascular: Negative.        Infected devise right chest  Gastrointestinal: Negative.   Neurological: Negative.        Objective:    Vitals BP (!) 158/88   Pulse 71   Temp 97.6 F (36.4 C)   Ht 6' 6 (1.981 m)   Wt (!) 321 lb (145.6 kg)   SpO2 93%   BMI 37.10 kg/m    Physical Examination Physical Exam Constitutional:      Appearance: Normal appearance. He is obese.  HENT:     Head: Normocephalic and atraumatic.  Cardiovascular:     Rate and Rhythm: Normal rate and regular rhythm.     Heart sounds: Normal heart sounds.     Comments: Red scar area right chest Pulmonary:     Effort: Pulmonary effort is normal.     Breath sounds: Normal breath sounds.  Abdominal:     General: Bowel sounds are normal.  Palpations: Abdomen is soft.  Neurological:     General: No focal deficit present.     Mental Status: He is alert and oriented to person, place, and time.        Assessment & Plan:   B12 deficiency  He take B12 supplement.  Depression   His depression is much better with bupropion  450 mg daily.  Dyslipidemia   He take atorvastatin 20 mg daily without any side effect.  OSA (obstructive sleep apnea)   He has hypo glossal nerve stimulator but they have not turned that on.  He could not use CPAP mass.  Hypertension   His blood pressure is uncontrolled so I will increase losartan  to 100 mg daily.  He will come back in 1 week for CMP drawn.    Return in about 3 months (around 10/19/2024) for 1 week for CMP.   Roetta Dare, MD

## 2024-07-20 NOTE — Assessment & Plan Note (Signed)
 He take atorvastatin 20 mg daily without any side effect.

## 2024-07-20 NOTE — Assessment & Plan Note (Signed)
 He has hypo glossal nerve stimulator but they have not turned that on.  He could not use CPAP mass.

## 2024-07-20 NOTE — Assessment & Plan Note (Signed)
 His depression is much better with bupropion  450 mg daily.

## 2024-07-24 ENCOUNTER — Telehealth: Payer: Self-pay | Admitting: Internal Medicine

## 2024-07-24 NOTE — Telephone Encounter (Signed)
 Copied from CRM [id number]. Topic: Appointments - Scheduling Inquiry for Clinic >> Jul 23, 2024 12:35 PM Shona S wrote: Reason for CRM: patient calling to schedule, tried to schedule but he got an implanted inspire device so it denied me from scheduling.    Attempted to call patient, no one answered so I left a voicemail.

## 2024-07-24 NOTE — Telephone Encounter (Signed)
 Spoke with Carl Edwards regarding his [employer]SP[employer] appointments. Scheduling was initiated, but due to his current issues with his INSPIRE implant, he will first see his provider tomorrow. He will contact me afterwards to complete scheduling. Nothing further needed at this time.

## 2024-07-25 ENCOUNTER — Encounter (INDEPENDENT_AMBULATORY_CARE_PROVIDER_SITE_OTHER): Payer: Self-pay | Admitting: Physician Assistant

## 2024-07-25 ENCOUNTER — Ambulatory Visit (INDEPENDENT_AMBULATORY_CARE_PROVIDER_SITE_OTHER): Admitting: Physician Assistant

## 2024-07-25 VITALS — BP 118/79 | HR 69 | Temp 98.2°F

## 2024-07-25 DIAGNOSIS — Z9682 Presence of neurostimulator: Secondary | ICD-10-CM

## 2024-07-25 DIAGNOSIS — Z9889 Other specified postprocedural states: Secondary | ICD-10-CM

## 2024-07-25 NOTE — Progress Notes (Signed)
 Dear Dr. Caleen, Here is my assessment for our mutual patient, Marcial Pless. Thank you for allowing me the opportunity to care for your patient. Please do not hesitate to contact me should you have any other questions. Sincerely, Chyrl Cohen PA-C  Otolaryngology Clinic Note Referring provider: Dr. Caleen HPI:  Azeem Poorman is a 53 y.o. male kindly referred by Dr. Caleen   The patient is a 53 YOM seen today for post op follow up SP hyoglossal nerve implant by Dr. Tobie on  07/04/2024. He was last seen in the office on 07/18/2024 by Dr. Tobie.  At his last office visit he had a seroma at the chest incision site, this was drained, he was placed on oral antibiotics.  He notes since that time he has had some accumulation of fluid.  He denies any fever today, he notes some discomfort at the incision site.  No issues with the neck incision.   Independent Review of Additional Tests or Records:  None   PMH/Meds/All/SocHx/FamHx/ROS:   Past Medical History:  Diagnosis Date   Acute hypoxemic respiratory failure (HCC) 06/12/2018   Anxiety 03/24/2017   Arthritis    Asthma    Bloody pleural effusion 06/12/2018   Chest pain in adult 11/13/2018   Complication of anesthesia    diff waking up   Depression 03/24/2017   Diverticulosis 03/24/2017   GERD (gastroesophageal reflux disease)    History of kidney stones    Hypertension    Knee pain, bilateral 03/24/2017   Migraine 04/19/2014   Morbid obesity with BMI of 40.0-44.9, adult (HCC) 12/06/2017   Obstructive sleep apnea 04/19/2014   does not use CPAP   Pituitary tumor 06/13/2014   Pleural effusion on right    Postoperative intestinal malabsorption 06/12/2018   Posttraumatic arthropathy 01/20/2015   Restless legs syndrome 08/21/2014   S/P laparoscopic sleeve gastrectomy 06/12/2018   Shortness of breath on exertion 08/03/2018     Past Surgical History:  Procedure Laterality Date   APPENDECTOMY  2019   CARPAL TUNNEL RELEASE Left    COLON  RESECTION  2002   DRUG INDUCED ENDOSCOPY N/A 04/23/2024   Procedure: DRUG INDUCED SLEEP ENDOSCOPY;  Surgeon: Tobie Eldora NOVAK, MD;  Location: [facility];  Service: ENT;  Laterality: N/A;   GASTRIC BYPASS  2019   HERNIA REPAIR  2002   HIP SURGERY Left    IMPLANTATION OF HYPOGLOSSAL NERVE STIMULATOR Right 07/04/2024   Procedure: RIGHT HYPOGLOSSAL NERVE STIMULATOR PLACEMENT;  Surgeon: Tobie Eldora NOVAK, MD;  Location: [facility];  Service: ENT;  Laterality: Right;   KNEE SURGERY Bilateral    LITHOTRIPSY      Family History  Problem Relation Age of Onset   Heart Problems Mother    Pulmonary embolism Mother    Lung cancer Father    Lung cancer Maternal Grandfather      Social Connections: Unknown (03/19/2022)   Received from [facility]   Social Network    Social Network: Not on file      Current Outpatient Medications:    albuterol (VENTOLIN HFA) 108 (90 Base) MCG/ACT inhaler, Inhale 2 puffs into the lungs every 6 (six) hours as needed for wheezing or shortness of breath., Disp: , Rfl:    atorvastatin (LIPITOR) 20 MG tablet, TAKE 1 TABLET(20 MG) BY MOUTH EVERY DAY AT BEDTIME, Disp: 30 tablet, Rfl: 2   buPROPion  (WELLBUTRIN  XL) 150 MG 24 hr tablet, TAKE 1 TABLET(150 MG) BY MOUTH DAILY, Disp: 30 tablet, Rfl:  3   buPROPion  (WELLBUTRIN  XL) 300 MG 24 hr tablet, TAKE 1 TABLET(300 MG) BY MOUTH DAILY, Disp: 30 tablet, Rfl: 3   cephALEXin  (KEFLEX ) 500 MG capsule, Take 1 capsule (500 mg total) by mouth 2 (two) times daily for 7 days., Disp: 14 capsule, Rfl: 0   diclofenac  Sodium (VOLTAREN  ARTHRITIS PAIN) 1 % GEL, Apply 2 g topically 4 (four) times daily. (Patient not taking: Reported on 07/18/2024), Disp: 4 g, Rfl: 0   famotidine (PEPCID) 40 MG tablet, Take 40 mg by mouth 4 (four) times daily., Disp: , Rfl:    fluticasone  (FLONASE ) 50 MCG/ACT nasal spray, SHAKE LIQUID AND USE 1 SPRAY IN EACH NOSTRIL DAILY, Disp: 16 g, Rfl: 1   ibuprofen  (MOTRIN  IB) 200 MG tablet, Take  2 tablets (400 mg total) by mouth every 6 (six) hours as needed., Disp: 100 tablet, Rfl: 2   losartan  (COZAAR ) 100 MG tablet, Take 1 tablet (100 mg total) by mouth daily., Disp: 30 tablet, Rfl: 6   MAGNESIUM GLYCINATE PO, Take by mouth., Disp: , Rfl:    ofloxacin  (OCUFLOX ) 0.3 % ophthalmic solution, INSTILL 1 DROP IN RIGHT EYE FOUR TIMES DAILY, Disp: 5 mL, Rfl: 0   omeprazole (PRILOSEC) 40 MG capsule, TAKE 1 CAPSULE(40 MG) BY MOUTH EVERY DAY BEFORE A MEAL, Disp: 30 capsule, Rfl: 3   oxyCODONE  (ROXICODONE ) 5 MG immediate release tablet, Take 2 tablets (10 mg total) by mouth every 6 (six) hours as needed (take 5mg  if moderate pain, take 10mg  if severe pain)., Disp: 25 tablet, Rfl: 0   promethazine  (PHENERGAN ) 25 MG tablet, Take 1 tablet (25 mg total) by mouth every 8 (eight) hours as needed for nausea or vomiting. TAKE 1 TABLET(25 MG) BY MOUTH EVERY 8 HOURS AS NEEDED FOR NAUSEA OR VOMITING, Disp: 20 tablet, Rfl: 0   tamsulosin  (FLOMAX ) 0.4 MG CAPS capsule, Take 2 capsules (0.8 mg total) by mouth daily., Disp: 60 capsule, Rfl: 11   tiZANidine (ZANAFLEX) 4 MG capsule, Take 4 mg by mouth 2 (two) times daily., Disp: , Rfl:    topiramate  (TOPAMAX ) 50 MG tablet, Take 1 tablet (50 mg total) by mouth 2 (two) times daily., Disp: 60 tablet, Rfl: 2   Physical Exam:   There were no vitals taken for this visit.  Pertinent Findings  CN II-XII intact Voice normal, tongue movement normal, lip with no weakness Neck incision clean dry and intact, no swelling redness warmth Right anterior chest incision clean dry and intact with some minimal underlying fluctuance, no overlying erythema or drainage No obviously palpable neck masses/lymphadenopathy/thyromegaly No respiratory distress or stridor   Seprately Identifiable Procedures:  None  Impression & Plans:  Darron Stuck is a 53 y.o. male with the following   Postop status post implantation of hypoglossal nerve stimulator-  Small seroma on exam today.  Dr.  Tobie did drain approximately 5 cc of serosanguineous fluid from the chest implant site.  No signs of infection on exam today.  Pressure dressing applied.  He like to see the patient back in the office in 1 week for repeat evaluation or sooner as needed.  He would recommend delaying activation by 10 days.   - f/u 1 week in our office   Thank you for allowing me the opportunity to care for your patient. Please do not hesitate to contact me should you have any other questions.  Sincerely, Chyrl Cohen PA-C [facility] Phone: [phone] Fax: [phone]  07/25/2024, 8:45 AM

## 2024-07-26 ENCOUNTER — Ambulatory Visit: Admitting: Internal Medicine

## 2024-07-26 DIAGNOSIS — E785 Hyperlipidemia, unspecified: Secondary | ICD-10-CM | POA: Diagnosis not present

## 2024-07-26 NOTE — Progress Notes (Signed)
 Nurse visit Blood Draw CMP

## 2024-07-27 ENCOUNTER — Ambulatory Visit: Payer: Self-pay

## 2024-07-27 LAB — CMP14 + ANION GAP
ALT: 12 IU/L (ref 0–44)
AST: 13 IU/L (ref 0–40)
Albumin: 4.2 g/dL (ref 3.8–4.9)
Alkaline Phosphatase: 59 IU/L (ref 47–123)
Anion Gap: 17 mmol/L (ref 10.0–18.0)
BUN/Creatinine Ratio: 13 (ref 9–20)
BUN: 13 mg/dL (ref 6–24)
Bilirubin Total: 0.5 mg/dL (ref 0.0–1.2)
CO2: 23 mmol/L (ref 20–29)
Calcium: 9.5 mg/dL (ref 8.7–10.2)
Chloride: 103 mmol/L (ref 96–106)
Creatinine, Ser: 1.04 mg/dL (ref 0.76–1.27)
Globulin, Total: 3 g/dL (ref 1.5–4.5)
Glucose: 121 mg/dL — ABNORMAL HIGH (ref 70–99)
Potassium: 4 mmol/L (ref 3.5–5.2)
Sodium: 143 mmol/L (ref 134–144)
Total Protein: 7.2 g/dL (ref 6.0–8.5)
eGFR: 85 mL/min/1.73 (ref >59)

## 2024-07-27 NOTE — Telephone Encounter (Signed)
 Spoke with Mr. Ottaway and obtain updated information. Patient had seroma at chest incision site, which was drained; currently on antibiotics (Cethalealezin 500mg  twice a day) ending tomorrow 9/18. Reports some fluid accumulations but denies fever, no issues with neck incision. Scheduled to return to provider next Wednesday 9/24 to recheck for infection. Patient asked provider if it is appropriate to schedule INSPIRE activation, provider advised okay after being seen next week. Landry has availability on 10/3 for activation. Please advise if scheduling should proceed now or wait until after next week follow-up. Per INSPIRE rep think it would be best to hold off on scheduling him for activation until he receives confirmation from ENT.

## 2024-07-27 NOTE — Telephone Encounter (Signed)
 Done

## 2024-07-30 ENCOUNTER — Telehealth: Payer: Self-pay

## 2024-07-30 NOTE — Telephone Encounter (Signed)
 Contacted Carl Edwards to inform him we are waiting on provider clearance to schedule INSPIRE activation. I previously spoke with patient last week regarding a seroma at his chest incision. Once cleared by his provider at follow-up on Wednesday 9/24, we will proceed with scheduling activation.

## 2024-07-30 NOTE — Telephone Encounter (Signed)
 PT has inspire device implanted. PT has urgent referral in and needs APT with provider.

## 2024-07-31 NOTE — Telephone Encounter (Signed)
 Contacted Carl Edwards and schedule his INSPIRE activation with provider Landry Ferrari on Friday, 10/3 @9 :00 am. He had initially requested to have his activation after follow-up for his seroma with ENT on Wednesday 9/24,I informed patient  Per Dr. Tobie, who indicated it would be fine to proceed as long as the seroma was resolved. However, we were not able to schedule him for the same day for INSPIRE activation. Carl Edwards is aware of the appointment and was provided a printed and mailed reminder. Nothing further needed at this time.

## 2024-08-01 ENCOUNTER — Encounter (INDEPENDENT_AMBULATORY_CARE_PROVIDER_SITE_OTHER): Payer: Self-pay | Admitting: Physician Assistant

## 2024-08-01 ENCOUNTER — Ambulatory Visit (INDEPENDENT_AMBULATORY_CARE_PROVIDER_SITE_OTHER): Admitting: Physician Assistant

## 2024-08-01 VITALS — BP 158/105 | HR 70 | Temp 98.4°F

## 2024-08-01 DIAGNOSIS — Z9889 Other specified postprocedural states: Secondary | ICD-10-CM

## 2024-08-01 DIAGNOSIS — Z9682 Presence of neurostimulator: Secondary | ICD-10-CM

## 2024-08-01 NOTE — Progress Notes (Unsigned)
 Dear Dr. Caleen, Here is my assessment for our mutual patient, Carl Edwards. Thank you for allowing me the opportunity to care for your patient. Please do not hesitate to contact me should you have any other questions. Sincerely, Chyrl Cohen PA-C  Otolaryngology Clinic Note Referring provider: Dr. Caleen HPI:  Carl Edwards is a 53 y.o. male kindly referred by Dr. Caleen   The patient is a 53 year old male seen in our office for postop follow-up status post hypoglossal nerve implant by Dr. Tobie on 07/04/2024.  He was last seen in the office on 07/25/2024.  At his last office visit he did have a seroma along the chest incision, this was drained by Dr. Tobie.  Since his last office visit he denies any complaints or concerns, no swelling, no redness, no pain, no fever.   Independent Review of Additional Tests or Records:  None   PMH/Meds/All/SocHx/FamHx/ROS:   Past Medical History:  Diagnosis Date   Acute hypoxemic respiratory failure (HCC) 06/12/2018   Anxiety 03/24/2017   Arthritis    Asthma    Bloody pleural effusion 06/12/2018   Chest pain in adult 11/13/2018   Complication of anesthesia    diff waking up   Depression 03/24/2017   Diverticulosis 03/24/2017   GERD (gastroesophageal reflux disease)    History of kidney stones    Hypertension    Knee pain, bilateral 03/24/2017   Migraine 04/19/2014   Morbid obesity with BMI of 40.0-44.9, adult (HCC) 12/06/2017   Obstructive sleep apnea 04/19/2014   does not use CPAP   Pituitary tumor 06/13/2014   Pleural effusion on right    Postoperative intestinal malabsorption 06/12/2018   Posttraumatic arthropathy 01/20/2015   Restless legs syndrome 08/21/2014   S/P laparoscopic sleeve gastrectomy 06/12/2018   Shortness of breath on exertion 08/03/2018     Past Surgical History:  Procedure Laterality Date   APPENDECTOMY  2019   CARPAL TUNNEL RELEASE Left    COLON RESECTION  2002   DRUG INDUCED ENDOSCOPY N/A 04/23/2024   Procedure: DRUG  INDUCED SLEEP ENDOSCOPY;  Surgeon: Tobie Eldora NOVAK, MD;  Location: [facility];  Service: ENT;  Laterality: N/A;   GASTRIC BYPASS  2019   HERNIA REPAIR  2002   HIP SURGERY Left    IMPLANTATION OF HYPOGLOSSAL NERVE STIMULATOR Right 07/04/2024   Procedure: RIGHT HYPOGLOSSAL NERVE STIMULATOR PLACEMENT;  Surgeon: Tobie Eldora NOVAK, MD;  Location: [facility];  Service: ENT;  Laterality: Right;   KNEE SURGERY Bilateral    LITHOTRIPSY      Family History  Problem Relation Age of Onset   Heart Problems Mother    Pulmonary embolism Mother    Lung cancer Father    Lung cancer Maternal Grandfather      Social Connections: Unknown (03/19/2022)   Received from [facility]   Social Network    Social Network: Not on file      Current Outpatient Medications:    albuterol (VENTOLIN HFA) 108 (90 Base) MCG/ACT inhaler, Inhale 2 puffs into the lungs every 6 (six) hours as needed for wheezing or shortness of breath., Disp: , Rfl:    atorvastatin (LIPITOR) 20 MG tablet, TAKE 1 TABLET(20 MG) BY MOUTH EVERY DAY AT BEDTIME, Disp: 30 tablet, Rfl: 2   buPROPion  (WELLBUTRIN  XL) 150 MG 24 hr tablet, TAKE 1 TABLET(150 MG) BY MOUTH DAILY, Disp: 30 tablet, Rfl: 3   buPROPion  (WELLBUTRIN  XL) 300 MG 24 hr tablet, TAKE 1 TABLET(300 MG) BY MOUTH DAILY, Disp:  30 tablet, Rfl: 3   diclofenac  Sodium (VOLTAREN  ARTHRITIS PAIN) 1 % GEL, Apply 2 g topically 4 (four) times daily. (Patient not taking: Reported on 07/18/2024), Disp: 4 g, Rfl: 0   famotidine (PEPCID) 40 MG tablet, Take 40 mg by mouth 4 (four) times daily., Disp: , Rfl:    fluticasone  (FLONASE ) 50 MCG/ACT nasal spray, SHAKE LIQUID AND USE 1 SPRAY IN EACH NOSTRIL DAILY, Disp: 16 g, Rfl: 1   ibuprofen  (MOTRIN  IB) 200 MG tablet, Take 2 tablets (400 mg total) by mouth every 6 (six) hours as needed., Disp: 100 tablet, Rfl: 2   losartan  (COZAAR ) 100 MG tablet, Take 1 tablet (100 mg total) by mouth daily., Disp: 30 tablet, Rfl: 6   MAGNESIUM  GLYCINATE PO, Take by mouth., Disp: , Rfl:    ofloxacin  (OCUFLOX ) 0.3 % ophthalmic solution, INSTILL 1 DROP IN RIGHT EYE FOUR TIMES DAILY, Disp: 5 mL, Rfl: 0   omeprazole (PRILOSEC) 40 MG capsule, TAKE 1 CAPSULE(40 MG) BY MOUTH EVERY DAY BEFORE A MEAL, Disp: 30 capsule, Rfl: 3   oxyCODONE  (ROXICODONE ) 5 MG immediate release tablet, Take 2 tablets (10 mg total) by mouth every 6 (six) hours as needed (take 5mg  if moderate pain, take 10mg  if severe pain)., Disp: 25 tablet, Rfl: 0   promethazine  (PHENERGAN ) 25 MG tablet, Take 1 tablet (25 mg total) by mouth every 8 (eight) hours as needed for nausea or vomiting. TAKE 1 TABLET(25 MG) BY MOUTH EVERY 8 HOURS AS NEEDED FOR NAUSEA OR VOMITING, Disp: 20 tablet, Rfl: 0   tamsulosin  (FLOMAX ) 0.4 MG CAPS capsule, Take 2 capsules (0.8 mg total) by mouth daily., Disp: 60 capsule, Rfl: 11   tiZANidine (ZANAFLEX) 4 MG capsule, Take 4 mg by mouth 2 (two) times daily., Disp: , Rfl:    topiramate  (TOPAMAX ) 50 MG tablet, Take 1 tablet (50 mg total) by mouth 2 (two) times daily., Disp: 60 tablet, Rfl: 2   Physical Exam:   BP (!) 158/105 (BP Location: Right Arm) Comment: first attempt 158/105 second attempt 158/107  Pulse 70   Temp 98.4 F (36.9 C)   SpO2 95%   Pertinent Findings  CN II-XII intact Voice normal, tongue movement normal, lip with no weakness Neck incision clean dry and intact, no swelling redness warmth Right anterior chest incision clean dry and intact with no fluctuance, no overlying erythema or drainage No obviously palpable neck masses/lymphadenopathy/thyromegaly No respiratory distress or stridor  Seprately Identifiable Procedures:  None  Impression & Plans:  Carl Edwards is a 53 y.o. male with the following   Postop status post implantation of hypoglossal nerve stimulator-  Doing well today with no complications or issues.  At this point the patient may follow-up on a as needed basis.,  He verbalized understanding and agreement to today's  plan had no further questions or concerns.  Okay to initiate activation.   - f/u PRN   Thank you for allowing me the opportunity to care for your patient. Please do not hesitate to contact me should you have any other questions.  Sincerely, Chyrl Cohen PA-C [facility] Phone: [phone] Fax: [phone]  08/01/2024, 11:48 AM

## 2024-08-10 ENCOUNTER — Encounter: Payer: Self-pay | Admitting: Primary Care

## 2024-08-10 ENCOUNTER — Ambulatory Visit: Admitting: Primary Care

## 2024-08-10 VITALS — BP 126/64 | HR 67 | Temp 97.7°F | Ht 78.0 in | Wt 318.6 lb

## 2024-08-10 DIAGNOSIS — G4709 Other insomnia: Secondary | ICD-10-CM | POA: Diagnosis not present

## 2024-08-10 DIAGNOSIS — G4733 Obstructive sleep apnea (adult) (pediatric): Secondary | ICD-10-CM

## 2024-08-10 MED ORDER — HYDROXYZINE HCL 25 MG PO TABS: 25.0000 mg | ORAL_TABLET | Freq: Every evening | ORAL | 0 refills | Status: DC | PRN
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
## 2024-08-10 NOTE — Patient Instructions (Addendum)
  VISIT SUMMARY: Today, we activated your Inspire hypoglossal nerve stimulator to help manage your obstructive sleep apnea. We also discussed your ongoing issues with insomnia and potential treatment options.  YOUR PLAN: -OBSTRUCTIVE SLEEP APNEA: Obstructive sleep apnea is a condition where your airway becomes blocked during sleep, causing breathing interruptions. We activated your hypoglossal nerve stimulator today, starting at a low setting to help you get used to it. You should increase the device by 1 level each week (FRIDAY), starting at level 1. The device is set to start 45 minutes after you go to bed, with a pause time of 15 minutes, and will run for 8 hours. We will follow up in one month to see how it's working and make any necessary adjustments.  -INSOMNIA: Insomnia is a condition where you have trouble falling asleep or staying asleep. We discussed that melatonin hasn't been effective for you and talked about trying hydroxyzine, which can help with anxiety and racing thoughts and has a low risk of dependency.  INSTRUCTIONS: Please increase the level of your hypoglossal nerve stimulator by 0.1 each week, starting at level 1. Use the remote control provided to adjust the settings. We will have a follow-up appointment in one month to assess how the device is working and make any necessary adjustments. If you decide to try hydroxyzine for your insomnia, please let us  know so we can provide a prescription.  Follow-up: We will call you to schedule follow-up in 4 weeks with Landry NP for [facility] check in

## 2024-08-16 ENCOUNTER — Ambulatory Visit: Payer: Self-pay

## 2024-08-16 NOTE — Telephone Encounter (Signed)
 FYI Only or Action Required?: Action required by provider: request for appointment.  Patient was last seen in primary care on 07/26/2024 by Caleen Dirks, MD.  Called Nurse Triage reporting Inspire device malfunction.  Symptoms began yesterday.  Interventions attempted: Nothing.  Symptoms are: stable. Last night pt. Turned on Inspire device and it was shocking him. Warm transfer to [place] in the practice.  Triage Disposition: See HCP Within 4 Hours (Or PCP Triage)  Patient/caregiver understands and will follow disposition?:      Copied from CRM #[id number]. Topic: Clinical - Red Word Triage >> Aug 16, 2024  8:06 AM Nathanel DEL wrote: Red Word that prompted transfer to Nurse Triage: last night pt's Inspire device (has had only a week), started shocking his tongue. Continued shocking wjile he was awake. Reason for Disposition  Nursing judgment or information in reference  Answer Assessment - Initial Assessment Questions 1. REASON FOR CALL: What is your main concern right now?     Inspire device was shocking pt. Last night 2. ONSET: When did the  start?     Last night 3. SEVERITY: How bad is the ?     severe 4. FUNCTIONAL IMPAIRMENT: How have things been going for you overall? Have you had more difficulty than usual doing your normal daily activities? (e.g., self-care, school, work, interactions)     N/a 5. RELIEVING AND AGGRAVATING FACTORS: What makes it better or worse? (e.g., certain activities, rest)     N/a 6. FEVER: Do you have a fever?     N/a 7. OTHER SYMPTOMS: Do you have any other new symptoms?     N/a 8. TREATMENTS AND RESPONSE: What have you done so far to try to make this better? What medicines have you used?     N/a 9. PREGNANCY: Is there any chance you are pregnant? When was your last menstrual period?     N/a  Protocols used: No Guideline Available-[facility]

## 2024-09-10 ENCOUNTER — Other Ambulatory Visit: Payer: Self-pay | Admitting: Primary Care

## 2024-09-10 NOTE — Telephone Encounter (Signed)
 Beth please advise pt is requesting refill for Atarax

## 2024-09-11 ENCOUNTER — Encounter (HOSPITAL_BASED_OUTPATIENT_CLINIC_OR_DEPARTMENT_OTHER): Payer: Self-pay | Admitting: Pharmacy Technician

## 2024-09-11 ENCOUNTER — Other Ambulatory Visit (HOSPITAL_BASED_OUTPATIENT_CLINIC_OR_DEPARTMENT_OTHER): Payer: Self-pay

## 2024-09-11 MED ORDER — OXYCODONE HCL 15 MG PO TABS
15.0000 mg | ORAL_TABLET | Freq: Three times a day (TID) | ORAL | 0 refills | Status: AC | PRN
  Filled 2024-09-12: qty 90, 30d supply, fill #0

## 2024-09-11 MED ORDER — TIZANIDINE HCL 4 MG PO TABS
4.0000 mg | ORAL_TABLET | Freq: Two times a day (BID) | ORAL | 2 refills | Status: AC | PRN
  Filled 2024-09-11: qty 60, 30d supply, fill #0
  Filled 2024-10-26: qty 60, 30d supply, fill #1

## 2024-09-12 ENCOUNTER — Other Ambulatory Visit (HOSPITAL_BASED_OUTPATIENT_CLINIC_OR_DEPARTMENT_OTHER): Payer: Self-pay

## 2024-09-13 ENCOUNTER — Other Ambulatory Visit: Payer: Self-pay | Admitting: Internal Medicine

## 2024-09-15 ENCOUNTER — Other Ambulatory Visit: Payer: Self-pay | Admitting: Internal Medicine

## 2024-09-18 ENCOUNTER — Other Ambulatory Visit: Payer: Self-pay

## 2024-09-18 ENCOUNTER — Encounter: Payer: Self-pay | Admitting: Internal Medicine

## 2024-09-18 MED ORDER — PROMETHAZINE HCL 25 MG PO TABS: 25.0000 mg | ORAL_TABLET | Freq: Three times a day (TID) | ORAL | 1 refills | Status: AC | PRN

## 2024-09-20 ENCOUNTER — Other Ambulatory Visit: Payer: Self-pay | Admitting: Internal Medicine

## 2024-09-21 ENCOUNTER — Encounter: Admitting: Primary Care
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
## 2024-10-18 ENCOUNTER — Encounter: Payer: Self-pay | Admitting: Internal Medicine

## 2024-10-18 ENCOUNTER — Ambulatory Visit: Admitting: Internal Medicine

## 2024-10-18 VITALS — BP 126/90 | HR 78 | Temp 98.5°F | Resp 18 | Ht 78.0 in | Wt 327.0 lb

## 2024-10-18 DIAGNOSIS — I10 Essential (primary) hypertension: Secondary | ICD-10-CM

## 2024-10-18 DIAGNOSIS — E785 Hyperlipidemia, unspecified: Secondary | ICD-10-CM | POA: Diagnosis not present

## 2024-10-18 MED ORDER — LOSARTAN POTASSIUM 50 MG PO TABS: 50.0000 mg | ORAL_TABLET | Freq: Every day | ORAL | 6 refills | Status: AC

## 2024-10-18 NOTE — Assessment & Plan Note (Signed)
 He will decrease the dose of losartan  50 mg daily.

## 2024-10-18 NOTE — Assessment & Plan Note (Signed)
 He take atorvastatin 20 mg daily

## 2024-10-18 NOTE — Progress Notes (Signed)
 Office Visit  Subjective   Patient ID: Carl Edwards   DOB: 03-Apr-1971   Age: 53 y.o.   MRN: [id number]   Chief Complaint Chief Complaint  Patient presents with   Follow-up    3 month     History of Present Illness   53 years old male who is here for follow-up.  He says that his blood pressure has been very low 83-88 systolic blood pressure at home and different times.  He was feeling dizzy and lightheaded.  His wife brought her blood pressure meter and confirmed that his blood pressure has been low so he stopped taking losartan  100 mg daily.  He has not take any medicine for for 5 days.  His blood pressure is 126/90.  I have suggested to decrease the dose of losartan  to 50 mg daily.    He also has hyperlipidemia and he takes atorvastatin 20 mg daily.  His lipid panel was done in January that was reviewed.    He also follows with pain clinic for his chronic hip and knee pain.  Past Medical History Past Medical History:  Diagnosis Date   Acute hypoxemic respiratory failure (HCC) 06/12/2018   Anxiety 03/24/2017   Arthritis    Asthma    Bloody pleural effusion 06/12/2018   Chest pain in adult 11/13/2018   Complication of anesthesia    diff waking up   Depression 03/24/2017   Diverticulosis 03/24/2017   GERD (gastroesophageal reflux disease)    History of kidney stones    Hypertension    Knee pain, bilateral 03/24/2017   Migraine 04/19/2014   Morbid obesity with BMI of 40.0-44.9, adult (HCC) 12/06/2017   Obstructive sleep apnea 04/19/2014   does not use CPAP   Pituitary tumor 06/13/2014   Pleural effusion on right    Postoperative intestinal malabsorption 06/12/2018   Posttraumatic arthropathy 01/20/2015   Restless legs syndrome 08/21/2014   S/P laparoscopic sleeve gastrectomy 06/12/2018   Shortness of breath on exertion 08/03/2018     Allergies Allergies[1]   Review of Systems Review of Systems  Constitutional: Negative.   HENT: Negative.    Respiratory:  Negative.    Cardiovascular: Negative.   Gastrointestinal: Negative.   Neurological: Negative.        Objective:    Vitals BP (!) 126/90 (BP Location: Left Arm, Patient Position: Sitting, Cuff Size: Normal)   Pulse 78   Temp 98.5 F (36.9 C)   Resp 18   Ht 6' 6 (1.981 m)   Wt (!) 327 lb (148.3 kg)   SpO2 96%   BMI 37.79 kg/m    Physical Examination Physical Exam Constitutional:      Appearance: Normal appearance.  HENT:     Head: Normocephalic and atraumatic.  Cardiovascular:     Rate and Rhythm: Normal rate and regular rhythm.     Heart sounds: Normal heart sounds.  Pulmonary:     Effort: Pulmonary effort is normal.     Breath sounds: Normal breath sounds.  Abdominal:     General: Bowel sounds are normal.     Palpations: Abdomen is soft.  Neurological:     General: No focal deficit present.     Mental Status: He is alert and oriented to person, place, and time.        Assessment & Plan:   Dyslipidemia He take atorvastatin 20 mg daily  Hypertension He will decrease the dose of losartan  50 mg daily.    Return in about 3  months (around 01/16/2025) for For PE on next visit.   Roetta Dare, MD      [1]  Allergies Allergen Reactions   Acetaminophen  Nausea Only

## 2024-10-19 ENCOUNTER — Encounter: Payer: Self-pay | Admitting: Primary Care

## 2024-10-19 ENCOUNTER — Ambulatory Visit: Admitting: Primary Care

## 2024-10-19 VITALS — BP 132/86 | HR 74 | Temp 98.1°F | Ht 78.0 in | Wt 329.0 lb

## 2024-10-19 DIAGNOSIS — G4700 Insomnia, unspecified: Secondary | ICD-10-CM | POA: Diagnosis not present

## 2024-10-19 DIAGNOSIS — Z9682 Presence of neurostimulator: Secondary | ICD-10-CM | POA: Diagnosis not present

## 2024-10-19 DIAGNOSIS — G4733 Obstructive sleep apnea (adult) (pediatric): Secondary | ICD-10-CM

## 2024-10-19 DIAGNOSIS — Z6838 Body mass index (BMI) 38.0-38.9, adult: Secondary | ICD-10-CM | POA: Diagnosis not present

## 2024-10-19 DIAGNOSIS — E669 Obesity, unspecified: Secondary | ICD-10-CM

## 2024-10-19 DIAGNOSIS — Z720 Tobacco use: Secondary | ICD-10-CM

## 2024-10-19 MED ORDER — ZEPBOUND 2.5 MG/0.5ML ~~LOC~~ SOAJ: 2.5000 mg | SUBCUTANEOUS | 1 refills | Status: AC

## 2024-10-19 MED ORDER — ESZOPICLONE 1 MG PO TABS: 1.0000 mg | ORAL_TABLET | Freq: Every evening | ORAL | 1 refills | Status: DC | PRN

## 2024-10-19 NOTE — Patient Instructions (Addendum)
°  Incoming level 4 (0.8). We change comfort settings by lowering rate and pulse width and decreased level to 3 (0.7). Stay on current level 3 for THREE weeks then slowly try increasing your level once every 1-2 weeks as tolerating.   Start lunesta 30-60min before bedtime  Start zepbound 2.5mg  weekly injection   Follow-up 4-6 week virtual visit with beth to check on Inspire compliance, sleep aid and weight loss medication    Notify your provider if you are planning to have a procedure/surgery, as this medication will need to be stopped prior.
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
## 2024-10-24 NOTE — Telephone Encounter (Signed)
 Advise her she can take 2mg  at bedtime, try that for a week and let me know if that helps

## 2024-10-26 ENCOUNTER — Other Ambulatory Visit (HOSPITAL_BASED_OUTPATIENT_CLINIC_OR_DEPARTMENT_OTHER): Payer: Self-pay

## 2024-11-05 ENCOUNTER — Other Ambulatory Visit (HOSPITAL_COMMUNITY): Payer: Self-pay

## 2024-11-05 ENCOUNTER — Telehealth: Payer: Self-pay

## 2024-11-05 NOTE — Telephone Encounter (Signed)
*  Pulm  Pharmacy Patient Advocate Encounter   Received notification from Patient Advice Request messages that prior authorization for Zepbound  2.5mg  pen is required/requested.   Insurance verification completed.   The patient is insured through [facility].   Per test claim: PA required; PA started via CoverMyMeds. KEY [id number] . Please see clinical question(s) below that I am not finding the answer to in their chart and advise.   Copy of sleep study records required to submit PA, thank you!

## 2024-11-07 ENCOUNTER — Other Ambulatory Visit (HOSPITAL_COMMUNITY): Payer: Self-pay

## 2024-11-07 NOTE — Telephone Encounter (Signed)
 PA submitted and chart notes attached. Pending determination

## 2024-11-08 MED ORDER — DOXEPIN HCL 3 MG PO TABS: 3.0000 mg | ORAL_TABLET | Freq: Every evening | ORAL | 0 refills | Status: AC | PRN

## 2024-11-08 NOTE — Telephone Encounter (Signed)
 Recommend patient stop lunesta , I have sent in prescription called doxepin - take 3mg  at bedtime prn insomnia   For my reference only, if not effective can increase to 6mg . Consider Roserem if not effective

## 2024-11-08 NOTE — Addendum Note (Signed)
 Addended by: HOPE ALMARIE ORN on: 11/08/2024 04:33 PM   Modules accepted: Orders

## 2024-11-12 MED ORDER — BELSOMRA 10 MG PO TABS: 10.0000 mg | ORAL_TABLET | Freq: Every day | ORAL | 1 refills | Status: AC

## 2024-11-12 NOTE — Addendum Note (Signed)
 Addended by: HOPE ALMARIE ORN on: 11/12/2024 02:22 PM   Modules accepted: Orders

## 2024-11-12 NOTE — Telephone Encounter (Signed)
 I'm sorry to hear that, I def do not expect him to pay that. There are other sleep aids we can try  -Stop all other sleep aids -Try Belsomra  10mg  at bedtime

## 2024-11-19 ENCOUNTER — Other Ambulatory Visit (HOSPITAL_COMMUNITY): Payer: Self-pay

## 2024-11-27 NOTE — Telephone Encounter (Signed)
 Patient will have new insurance info after 12/09/24. He will call with update.

## 2024-11-30 ENCOUNTER — Ambulatory Visit: Admitting: Primary Care

## 2025-01-18 ENCOUNTER — Encounter: Admitting: Internal Medicine
